# Patient Record
Sex: Female | Born: 1990 | Race: Black or African American | Hispanic: No | Marital: Single | Smoking: Former smoker
Health system: Southern US, Community
[De-identification: ages and names within clinical notes are randomized; demographics above are authoritative.]

## PROBLEM LIST (undated history)

## (undated) DIAGNOSIS — J45909 Unspecified asthma, uncomplicated: Secondary | ICD-10-CM

## (undated) DIAGNOSIS — R030 Elevated blood-pressure reading, without diagnosis of hypertension: Secondary | ICD-10-CM

## (undated) DIAGNOSIS — L732 Hidradenitis suppurativa: Secondary | ICD-10-CM

## (undated) DIAGNOSIS — R51 Headache: Secondary | ICD-10-CM

## (undated) DIAGNOSIS — F32A Depression, unspecified: Secondary | ICD-10-CM

## (undated) DIAGNOSIS — R519 Headache, unspecified: Secondary | ICD-10-CM

## (undated) DIAGNOSIS — F329 Major depressive disorder, single episode, unspecified: Secondary | ICD-10-CM

## (undated) HISTORY — PX: ADENOIDECTOMY: SUR15

## (undated) HISTORY — DX: Headache, unspecified: R51.9

## (undated) HISTORY — PX: TONSILLECTOMY AND ADENOIDECTOMY: SUR1326

## (undated) HISTORY — DX: Major depressive disorder, single episode, unspecified: F32.9

## (undated) HISTORY — DX: Headache: R51

## (undated) HISTORY — DX: Hidradenitis suppurativa: L73.2

## (undated) HISTORY — DX: Elevated blood-pressure reading, without diagnosis of hypertension: R03.0

## (undated) HISTORY — PX: WISDOM TOOTH EXTRACTION: SHX21

## (undated) HISTORY — DX: Depression, unspecified: F32.A

---
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
## 2012-06-08 ENCOUNTER — Encounter (HOSPITAL_COMMUNITY): Payer: Self-pay | Admitting: *Deleted

## 2012-06-08 ENCOUNTER — Emergency Department (HOSPITAL_COMMUNITY)
Admission: EM | Admit: 2012-06-08 | Discharge: 2012-06-08 | Disposition: A | Discharge status: Home / Self Care | Payer: Medicaid Other | Attending: Emergency Medicine | Admitting: Emergency Medicine

## 2012-06-08 DIAGNOSIS — M549 Dorsalgia, unspecified: Secondary | ICD-10-CM | POA: Insufficient documentation

## 2012-06-08 DIAGNOSIS — N390 Urinary tract infection, site not specified: Secondary | ICD-10-CM

## 2012-06-08 DIAGNOSIS — J45909 Unspecified asthma, uncomplicated: Secondary | ICD-10-CM | POA: Insufficient documentation

## 2012-06-08 DIAGNOSIS — Z3202 Encounter for pregnancy test, result negative: Secondary | ICD-10-CM | POA: Insufficient documentation

## 2012-06-08 HISTORY — DX: Unspecified asthma, uncomplicated: J45.909

## 2012-06-08 LAB — URINALYSIS, ROUTINE W REFLEX MICROSCOPIC
Glucose, UA: NEGATIVE mg/dL
Nitrite: NEGATIVE
Urobilinogen, UA: 0.2 mg/dL (ref 0.0–1.0)

## 2012-06-08 LAB — POCT PREGNANCY, URINE: Preg Test, Ur: NEGATIVE

## 2012-06-08 LAB — URINE MICROSCOPIC-ADD ON

## 2012-06-08 MED ORDER — METHOCARBAMOL 500 MG PO TABS: 500.0000 mg | ORAL_TABLET | Freq: Two times a day (BID) | ORAL | Status: DC | PRN

## 2012-06-08 MED ORDER — SULFAMETHOXAZOLE-TRIMETHOPRIM 800-160 MG PO TABS: 1.0000 | ORAL_TABLET | Freq: Two times a day (BID) | ORAL | Status: DC

## 2012-06-08 NOTE — ED Notes (Signed)
Pt reports onset of back pain 3 months ago. Denies any injury. Pain has gotten worse over past few days. Pain worse on lower back, but c/o entire back hurting. Pain radiates to left flank, describes as "stabbing".

## 2012-06-08 NOTE — ED Notes (Signed)
PA notified of blood tinged urine. PA verbalized to continue urinalysis with urine sample obtained.

## 2012-06-08 NOTE — ED Provider Notes (Signed)
History     CSN: [id number]  Arrival date & time 06/08/12  1128   First MD Initiated Contact with Patient 06/08/12 1140      Chief Complaint  Patient presents with  . Back Pain    (Consider location/radiation/quality/duration/timing/severity/associated sxs/prior treatment) HPI Comments: Patient is a 22 year old female who presents today with thoracic and low back pain. It has been present for the past 3 months, but has recently worsened in the past few days. She describes the pain as stabbing radiating up and down the sides of her back. She is able to walk. Nothing has made the pain better, the pain is worse with movement. She reports she is having urinary frequency and urgency, but no dysuria. No fevers, chills, abdominal pain, vomiting, SOB, CP, numbness, weakness.      Past Medical History  Diagnosis Date  . Asthma     Past Surgical History  Procedure Laterality Date  . Tonsillectomy      No family history on file.  History  Substance Use Topics  . Smoking status: Never Smoker   . Smokeless tobacco: Not on file  . Alcohol Use: Yes     Comment: socially    OB History   Grav Para Term Preterm Abortions TAB SAB Ect Mult Living                  Review of Systems  Constitutional: Negative for fever and chills.  Cardiovascular: Negative for chest pain.  Gastrointestinal: Negative for vomiting and abdominal pain.  Genitourinary: Positive for urgency and frequency. Negative for dysuria and difficulty urinating.  All other systems reviewed and are negative.    Allergies  Review of patient's allergies indicates no known allergies.  Home Medications   Current Outpatient Rx  Name  Route  Sig  Dispense  Refill  . acetaminophen (TYLENOL) 500 MG tablet   Oral   Take 500 mg by mouth every 6 (six) hours as needed for pain.         Marland Kitchen albuterol (PROVENTIL HFA;VENTOLIN HFA) 108 (90 BASE) MCG/ACT inhaler   Inhalation   Inhale 2 puffs into the lungs every 6 (six)  hours as needed for wheezing.           BP 145/86  Pulse 79  Temp(Src) 98.7 F (37.1 C) (Oral)  Resp 14  SpO2 100%  LMP 06/07/2012  Physical Exam  Nursing note and vitals reviewed. Constitutional: She is oriented to person, place, and time. She appears well-developed and well-nourished. No distress.  HENT:  Head: Normocephalic and atraumatic.  Right Ear: External ear normal.  Left Ear: External ear normal.  Nose: Nose normal.  Mouth/Throat: Oropharynx is clear and moist.  Eyes: Conjunctivae are normal.  Neck: Normal range of motion.  Cardiovascular: Normal rate, regular rhythm and normal heart sounds.   Pulmonary/Chest: Effort normal and breath sounds normal. No stridor. No respiratory distress. She has no wheezes. She has no rales.  Abdominal: Soft. Bowel sounds are normal. She exhibits no distension. There is no tenderness. There is no CVA tenderness.  Musculoskeletal: Normal range of motion.  ttp over musculature of right thoracic and lumbar back No bony tenderness, deformity, or step offs   Neurological: She is alert and oriented to person, place, and time. She has normal strength.  Skin: Skin is warm and dry. She is not diaphoretic. No erythema.  Psychiatric: She has a normal mood and affect. Her behavior is normal.    ED Course  Procedures (including critical care time)  Labs Reviewed  URINALYSIS, ROUTINE W REFLEX MICROSCOPIC - Abnormal; Notable for the following:    Color, Urine RED (*)    APPearance CLOUDY (*)    Hgb urine dipstick LARGE (*)    Protein, ur 30 (*)    Leukocytes, UA SMALL (*)    All other components within normal limits  URINE MICROSCOPIC-ADD ON - Abnormal; Notable for the following:    Bacteria, UA MANY (*)    All other components within normal limits  URINE CULTURE  POCT PREGNANCY, URINE   No results found.  When I entered the room to discuss the patient's discharge, the patient put down her phone and was crying. She told me that it had  nothing to do with the reason that she was here and did not want to talk about the reason she was crying.   1. UTI (lower urinary tract infection)   2. Back pain       MDM  Patient presents with 3 months of back pain and urinary urgency and frequency. She has no red flags including bowel or bladder incontinence, IVDA, or hx of CA. No concern for cauda equina, spinal abscess, or fracture. Due to urinary urgency and frequency I will treat her with a 3 day course of Bactrim for UTI. Rest, ice back. Resource guide given so she can establish care with PCP. Strict return precautions given. VSS for discharge. Patient / Family / Caregiver informed of clinical course, understand medical decision-making process, and agree with plan.         Mora Bellman, PA-C 06/10/12 [phone]

## 2012-06-10 LAB — URINE CULTURE: Colony Count: >=100000

## 2012-06-11 ENCOUNTER — Telehealth (HOSPITAL_COMMUNITY): Payer: Self-pay | Admitting: Emergency Medicine

## 2012-06-11 NOTE — ED Provider Notes (Signed)
Medical screening examination/treatment/procedure(s) were performed by non-physician practitioner and as supervising physician I was immediately available for consultation/collaboration.    Roseanne Juenger R Pernie Grosso, MD 06/11/12 0423 

## 2013-04-09 ENCOUNTER — Other Ambulatory Visit (HOSPITAL_COMMUNITY)
Admission: RE | Admit: 2013-04-09 | Discharge: 2013-04-09 | Disposition: A | Discharge status: Home / Self Care | Payer: Medicaid Other | Source: Ambulatory Visit | Attending: Sports Medicine | Admitting: Sports Medicine

## 2013-04-09 ENCOUNTER — Ambulatory Visit (INDEPENDENT_AMBULATORY_CARE_PROVIDER_SITE_OTHER): Payer: Medicaid Other | Admitting: Sports Medicine

## 2013-04-09 ENCOUNTER — Encounter: Payer: Self-pay | Admitting: Sports Medicine

## 2013-04-09 VITALS — BP 122/81 | HR 75 | Temp 98.7°F | Ht 66.5 in | Wt 314.0 lb

## 2013-04-09 DIAGNOSIS — R51 Headache: Secondary | ICD-10-CM

## 2013-04-09 DIAGNOSIS — R519 Headache, unspecified: Secondary | ICD-10-CM | POA: Insufficient documentation

## 2013-04-09 DIAGNOSIS — Z299 Encounter for prophylactic measures, unspecified: Secondary | ICD-10-CM

## 2013-04-09 DIAGNOSIS — Z209 Contact with and (suspected) exposure to unspecified communicable disease: Secondary | ICD-10-CM

## 2013-04-09 DIAGNOSIS — Z113 Encounter for screening for infections with a predominantly sexual mode of transmission: Secondary | ICD-10-CM | POA: Insufficient documentation

## 2013-04-09 DIAGNOSIS — M545 Low back pain, unspecified: Secondary | ICD-10-CM | POA: Insufficient documentation

## 2013-04-09 LAB — COMPREHENSIVE METABOLIC PANEL
ALK PHOS: 79 U/L (ref 39–117)
ALT: 22 U/L (ref 0–35)
AST: 17 U/L (ref 0–37)
Albumin: 3.9 g/dL (ref 3.5–5.2)
BUN: 9 mg/dL (ref 6–23)
CHLORIDE: 106 meq/L (ref 96–112)
CO2: 26 mEq/L (ref 19–32)
CREATININE: 0.9 mg/dL (ref 0.50–1.10)
Calcium: 9.3 mg/dL (ref 8.4–10.5)
Glucose, Bld: 91 mg/dL (ref 70–99)
POTASSIUM: 4.4 meq/L (ref 3.5–5.3)
Sodium: 138 mEq/L (ref 135–145)
TOTAL PROTEIN: 6.8 g/dL (ref 6.0–8.3)
Total Bilirubin: 0.3 mg/dL (ref 0.2–1.2)

## 2013-04-09 LAB — CBC
HEMATOCRIT: 40 % (ref 36.0–46.0)
Hemoglobin: 13.1 g/dL (ref 12.0–15.0)
MCH: 28.9 pg (ref 26.0–34.0)
MCHC: 32.8 g/dL (ref 30.0–36.0)
MCV: 88.3 fL (ref 78.0–100.0)
Platelets: 294 10*3/uL (ref 150–400)
RBC: 4.53 MIL/uL (ref 3.87–5.11)
RDW: 14.8 % (ref 11.5–15.5)
WBC: 7.1 10*3/uL (ref 4.0–10.5)

## 2013-04-09 LAB — LIPID PANEL
Cholesterol: 194 mg/dL (ref 0–200)
HDL: 41 mg/dL (ref >39)
LDL CALC: 124 mg/dL — AB (ref 0–99)
TRIGLYCERIDES: 145 mg/dL (ref <150)
Total CHOL/HDL Ratio: 4.7 Ratio
VLDL: 29 mg/dL (ref 0–40)

## 2013-04-09 LAB — HIV ANTIBODY (ROUTINE TESTING W REFLEX): HIV: NONREACTIVE

## 2013-04-09 LAB — RPR

## 2013-04-09 MED ORDER — ALBUTEROL SULFATE HFA 108 (90 BASE) MCG/ACT IN AERS: 2.0000 | INHALATION_SPRAY | Freq: Four times a day (QID) | RESPIRATORY_TRACT | Status: DC | PRN

## 2013-04-09 NOTE — Progress Notes (Signed)
Roberta Rodriguez - 23 y.o. female MRN [id number]  Date of birth: 02-28-1990  Chief Complaint  Patient presents with  . Establish Care   Problems/Dx addressed during visit General Plan and Pt Instructions  1. Preventive measure   2. Vaginitis and vulvovaginitis, unspecified   3. Obesity, morbid   4. Headache(784.0)       Start with basic exercises for your back and neck including towel stretch his endoluminal exercises as we described.  Do these at least once per day and work your way up to 5 times daily.  Return for a PAP smear at your convience  We are checking some labs today including for STIs.  We will discuss these results at your next visit.  Sign up for Mychart      HPI, INTERVAL HISTORY & ROS    She reports overall having significant lack of energy with chronic daily low back pain and headaches.  She takes daily Excedrin migraine but awakens each morning with recurrent symptoms.  No changes in vision.  No photophobia, nausea or vomiting.  Usually right sided, radiating from posterior aspect of school up.  Usually when headache is present there is a "knot on the back of her head."  She reports her overall mood is okay and denies any depression at this time has been treated for depression in the past.  History of asthma with frequent albuterol use.  Reports significant dyspnea on exertion that she associates with her weight and asthma.  No orthopnea, known sleep apnea.  Current every day smoker.  Would like to be checked for sexually transmitted infections.  She is actually active.  Has not had a Pap smear.  No history of sexual transmitted infections.  Denies dysuria, frequency, discharge.  LMP last week.  Pt denies any radicular symptoms, change in bowel or bladder habits, muscle weakness or falls associated with back pain.  No fevers, chills, night sweats or weight loss.  Pertinent History & Care Coordination  No Patient Care Coordination Note on file.  History    Smoking status  . Current Every Day Smoker -- 0.30 packs/day  . Types: Cigarettes  Smokeless tobacco  . Not on file  No health maintenance topics applied. No results found for this basename: HGBA1C, TRIG, CHOL, HDL, LDLCALC, LDLDIRECT, TSH, T3FREE, FREET4, T3TOTAL, T4TOTAL, THYROIDAB,  in the last 8760 hours     Otherwise past Medical, Surgical, Social, and Family History Reviewed per EMR Medications and Allergies reviewed and all updated if necessary. Objective Findings  VITALS: HR: 75 bpm  BP: 122/81 mmHg  TEMP: 98.7 F (37.1 C) (Oral)  RESP:    HT: 5' 6.5" (168.9 cm)  WT: 314 lb (142.429 kg)  BMI: 50   BP Readings from Last 3 Encounters:  04/09/13 122/81  06/08/12 140/80   Wt Readings from Last 3 Encounters:  04/09/13 314 lb (142.429 kg)     PHYSICAL EXAM: GENERAL:  morbidly obese African American female. In no discomfort; no respiratory distress  PSYCH: alert and appropriate, good insight   HNEENT:  no JVD, broad-based neck.    CARDIO: RRR, S1/S2 heard, no murmur  LUNGS: CTA B, no wheezes, no crackles  ABDOMEN:  obese   EXTREM:   moves all 4 extremities spontaneously  Low back Exam: Appear:  normal alignment but difficult to evaluate due to obesity   Palp:  diffuse lumbar tenderness including diffuse midline tenderness   ROM:  Limited flexion extension due to obesity   NV:  lower extremity myotomes dermatomes intact to gross evaluation   Testing:  negative straight leg raise bilaterally    Neck Exam: Appear:  normal alignment   Palp:  diffuse paracervical tenderness and muscle spasms   ROM:  Limited rotation to the right   NV:   upper extremity myotomes and dermatomes intact   Testing:  negative Spurling's     GU:   SKIN:     Medications, Labs & Other Orders   Previous Medications   No medications on file   Modified Medications   Modified Medication Previous Medication   ALBUTEROL (PROVENTIL HFA;VENTOLIN HFA) 108 (90 BASE) MCG/ACT INHALER albuterol  (PROVENTIL HFA;VENTOLIN HFA) 108 (90 BASE) MCG/ACT inhaler      Inhale 2 puffs into the lungs every 6 (six) hours as needed for wheezing.    Inhale 2 puffs into the lungs every 6 (six) hours as needed for wheezing.   New Prescriptions   No medications on file   Discontinued Medications   ACETAMINOPHEN (TYLENOL) 500 MG TABLET    Take 500 mg by mouth every 6 (six) hours as needed for pain.   METHOCARBAMOL (ROBAXIN) 500 MG TABLET    Take 1 tablet (500 mg total) by mouth 2 (two) times daily as needed.   SULFAMETHOXAZOLE-TRIMETHOPRIM (SEPTRA DS) 800-160 MG PER TABLET    Take 1 tablet by mouth 2 (two) times daily.   Orders Placed This Encounter  Procedures  . Comprehensive metabolic panel  . CBC  . Lipid panel  . HIV antibody  . RPR   Assessment & Plan  As above & for further discussion see problem based charting if applicable.

## 2013-04-09 NOTE — Assessment & Plan Note (Signed)
Encouraged significant TLC (Therapeutic Lifestyle Changes)

## 2013-04-09 NOTE — Assessment & Plan Note (Signed)
No red flags. Likely similar etiology as above with increase strain do to obesity. Encourage range of motion exercises for the lumbar spine.

## 2013-04-09 NOTE — Assessment & Plan Note (Signed)
Basic labs including STI screening.  Patient currently asymptomatic but essentially active. Patient will return for initial Pap smear in the next 1-2 weeks.

## 2013-04-09 NOTE — Patient Instructions (Signed)
   Start with basic exercises for your back and neck including towel stretch his endoluminal exercises as we described.  Do these at least once per day and work your way up to 5 times daily.  Return for a PAP smear at your convience  We are checking some labs today including for STIs.  We will discuss these results at your next visit.  Sign up for Mychart    If you need anything prior to your next visit please call the clinic. Please Bring all medications or accurate medication list with you to each appointment; an accurate medication list is essential in providing you the best care possible.

## 2013-04-09 NOTE — Assessment & Plan Note (Addendum)
Multiple etiologies likely due to MSK causes did increase strain from prolonged sitting at a desk and morbid obesity. Reviewed range of motion and exercise activities including towel stretching. Recommending avoiding all NSAIDs for concern of medication overuse headache that she's been taking daily Excedrin for a prolonged period

## 2013-04-12 ENCOUNTER — Encounter: Payer: Self-pay | Admitting: Sports Medicine

## 2013-04-29 ENCOUNTER — Other Ambulatory Visit (HOSPITAL_COMMUNITY)
Admission: RE | Admit: 2013-04-29 | Discharge: 2013-04-29 | Disposition: A | Discharge status: Home / Self Care | Payer: Medicaid Other | Source: Ambulatory Visit | Attending: Sports Medicine | Admitting: Sports Medicine

## 2013-04-29 ENCOUNTER — Ambulatory Visit (INDEPENDENT_AMBULATORY_CARE_PROVIDER_SITE_OTHER): Payer: Medicaid Other | Admitting: Sports Medicine

## 2013-04-29 ENCOUNTER — Encounter: Payer: Self-pay | Admitting: Sports Medicine

## 2013-04-29 VITALS — BP 148/95 | HR 84 | Temp 98.1°F | Ht 66.0 in | Wt 319.1 lb

## 2013-04-29 DIAGNOSIS — Z299 Encounter for prophylactic measures, unspecified: Secondary | ICD-10-CM

## 2013-04-29 DIAGNOSIS — N611 Abscess of the breast and nipple: Secondary | ICD-10-CM

## 2013-04-29 DIAGNOSIS — L02239 Carbuncle of trunk, unspecified: Secondary | ICD-10-CM

## 2013-04-29 DIAGNOSIS — Z124 Encounter for screening for malignant neoplasm of cervix: Secondary | ICD-10-CM

## 2013-04-29 DIAGNOSIS — L02224 Furuncle of groin: Secondary | ICD-10-CM

## 2013-04-29 DIAGNOSIS — L02229 Furuncle of trunk, unspecified: Secondary | ICD-10-CM

## 2013-04-29 DIAGNOSIS — Z01419 Encounter for gynecological examination (general) (routine) without abnormal findings: Secondary | ICD-10-CM | POA: Insufficient documentation

## 2013-04-29 DIAGNOSIS — N61 Mastitis without abscess: Secondary | ICD-10-CM

## 2013-04-29 MED ORDER — DOXYCYCLINE HYCLATE 100 MG PO TABS: 100.0000 mg | ORAL_TABLET | Freq: Two times a day (BID) | ORAL | Status: DC

## 2013-04-29 NOTE — Assessment & Plan Note (Signed)
PAP smear today.  

## 2013-04-29 NOTE — Patient Instructions (Signed)
   Take doxy for 10 days  Use moist heat twice a day for 3-5 days to both breast and groin  I will send you results of your PAP smear    If you need anything prior to your next visit please call the clinic. Please Bring all medications or accurate medication list with you to each appointment; an accurate medication list is essential in providing you the best care possible.

## 2013-04-29 NOTE — Progress Notes (Signed)
  Roberta Rodriguez - 23 y.o. female MRN [id number]  Date of birth: 11-12-1990  CC & Problems Addressed: General Plan & Pt Instructions:  Chief Complaint  Patient presents with  . Gynecologic Exam      Take doxy for 10 days  Use moist heat twice a day for 3-5 days to both breast and groin  I will send you results of your PAP smear      SUBJECTIVE:   She reports needing a pap smear and having 2 skin lesions  Left breast has been present for approximately 2 weeks, came to ahead and rupture with draining serosanguineous fluid over the past 3 days.  Left labial lesion patient reports intermittently having due to shaving.  Also present for 3 days.  Pt denies any fevers, chills, or rigors.  No nausea or vomiting.  No dysuria, frequency, hesitancy or flank pain. For further subjective including (HPI, Interval History & ROS) please see problem based charting  HISTORY:  Recent Labs  04/09/13 1140  TRIG 145  CHOL 194  HDL 41  LDLCALC 124*   Wt Readings from Last 3 Encounters:  04/29/13 319 lb 1.6 oz (144.743 kg)  04/09/13 314 lb (142.429 kg)   BP Readings from Last 3 Encounters:  04/29/13 148/95  04/09/13 122/81  06/08/12 140/80    History  Smoking status  . Current Every Day Smoker -- 0.30 packs/day  . Types: Cigarettes  Smokeless tobacco  . Not on file   Health Maintenance Due  Topic  . Tetanus/tdap     Otherwise past Medical, Surgical, Social, and Family History Reviewed per EMR Medications and Allergies reviewed and updated per below.  VITALS: Reviewed per EMR.   PHYSICAL EXAM: GENERAL: Adult African American, obese  female. In no discomfort; no respiratory distress  PSYCH: alert and appropriate, good insight   GU: Chaperone: CMA, Clarise Cruz  External: Left superior labial non-fluctuant closed furuncle. Shaved mons, Otherwise normal appearing genitalia, no discharge  Vagina: no vaginal lesion, vaginal mucosa moist  Cervix: no cervical lesions, no CMT  Uterus:  normal size, shape, consistency  non-tender  Adenexa: no masses, non-tender  TESTS:  PAP smear    SKIN: Left breast with small approximately 0.5 cm open/draining     MEDICATIONS, LABS & OTHER ORDERS: Previous Medications   ALBUTEROL (PROVENTIL HFA;VENTOLIN HFA) 108 (90 BASE) MCG/ACT INHALER    Inhale 2 puffs into the lungs every 6 (six) hours as needed for wheezing.   Modified Medications   No medications on file   New Prescriptions   DOXYCYCLINE (VIBRA-TABS) 100 MG TABLET    Take 1 tablet (100 mg total) by mouth 2 (two) times daily.   Discontinued Medications   No medications on file  No orders of the defined types were placed in this encounter.    ASSESSMENT & PLAN:  See problem based charting & AVS for pt instructions.

## 2013-04-30 ENCOUNTER — Encounter: Payer: Self-pay | Admitting: Sports Medicine

## 2013-04-30 NOTE — Assessment & Plan Note (Addendum)
Acute, resulting condition Exam reassuring for no deep abscess Given multiple lesions will treat with doxycycline for 10 days.   > Follow up resolution, consider breast imaging if persistent and referral to surgery versus I&D

## 2013-04-30 NOTE — Assessment & Plan Note (Signed)
Acute recurrent condition. discussed proper hygiene including the avoidance of shaving if appropriate Small and superficial.  Will likely drain spontaneously with warm compresses as instructed.  Doxy for 10 days.

## 2013-05-21 ENCOUNTER — Encounter: Payer: Self-pay | Admitting: Sports Medicine

## 2013-06-05 ENCOUNTER — Ambulatory Visit (INDEPENDENT_AMBULATORY_CARE_PROVIDER_SITE_OTHER): Payer: Medicaid Other | Admitting: Sports Medicine

## 2013-06-05 ENCOUNTER — Encounter: Payer: Self-pay | Admitting: Sports Medicine

## 2013-06-05 VITALS — BP 140/87 | HR 86 | Temp 99.3°F | Ht 66.5 in | Wt 314.0 lb

## 2013-06-05 DIAGNOSIS — F32A Depression, unspecified: Secondary | ICD-10-CM

## 2013-06-05 DIAGNOSIS — F3289 Other specified depressive episodes: Secondary | ICD-10-CM

## 2013-06-05 DIAGNOSIS — F329 Major depressive disorder, single episode, unspecified: Secondary | ICD-10-CM

## 2013-06-05 MED ORDER — FLUOXETINE HCL 20 MG PO TABS: 20.0000 mg | ORAL_TABLET | Freq: Every day | ORAL | Status: DC

## 2013-06-05 NOTE — Patient Instructions (Signed)
Call [facility] to schedule counseling ASAP.  If you can follow up with your prior psychologist that would be great! Please send me a message about how the medication is doing next week. Follow up in 10 days with me

## 2013-06-08 DIAGNOSIS — F39 Unspecified mood [affective] disorder: Secondary | ICD-10-CM | POA: Insufficient documentation
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
## 2013-06-24 ENCOUNTER — Ambulatory Visit: Payer: Medicaid Other | Admitting: Sports Medicine

## 2013-07-01 ENCOUNTER — Ambulatory Visit (INDEPENDENT_AMBULATORY_CARE_PROVIDER_SITE_OTHER): Payer: Medicaid Other | Admitting: Sports Medicine

## 2013-07-01 ENCOUNTER — Encounter: Payer: Self-pay | Admitting: Sports Medicine

## 2013-07-01 VITALS — BP 130/58 | HR 91 | Temp 100.3°F | Ht 66.5 in | Wt 310.0 lb

## 2013-07-01 DIAGNOSIS — Z299 Encounter for prophylactic measures, unspecified: Secondary | ICD-10-CM

## 2013-07-01 DIAGNOSIS — Z23 Encounter for immunization: Secondary | ICD-10-CM

## 2013-07-01 DIAGNOSIS — F39 Unspecified mood [affective] disorder: Secondary | ICD-10-CM

## 2013-07-01 NOTE — Addendum Note (Signed)
Addended by: Corinna Capra on: 07/01/2013 12:19 PM   Modules accepted: Orders

## 2013-07-01 NOTE — Assessment & Plan Note (Signed)
Tdap Today

## 2013-07-01 NOTE — Progress Notes (Signed)
  Roberta Rodriguez - 23 y.o. female MRN [id number]  Date of birth: 12-19-1990  CC: Depression   SUBJECTIVE:     HPI Comments: Patient presents with:   Depression - follow up - better with prozac. Still tearful See problem based charting for additional problem specific subjective (including HPI, Interval History & ROS)   HISTORY:  Recent Labs  04/09/13 1140  TRIG 145  CHOL 194  HDL 41  LDLCALC 124*   History  Smoking status  . Current Every Day Smoker -- 0.30 packs/day  . Types: Cigarettes  Smokeless tobacco  . Not on file  Otherwise past Medical, Surgical, Social, and Family History Reviewed per EMR Medications and Allergies reviewed and updated per below.  OBJECTIVE: VITALS: BP: 130/58 mmHg  HR: 91 bpm  TEMP: 100.3 F (37.9 C) (Oral)  RESP:    HT: 5' 6.5" (168.9 cm)  WT: 310 lb (140.615 kg)  BMI: 49.4   BP Readings from Last 3 Encounters:  07/01/13 130/58  06/05/13 140/87  04/29/13 148/95   Wt Readings from Last 3 Encounters:  07/01/13 310 lb (140.615 kg)  06/05/13 314 lb (142.429 kg)  04/29/13 319 lb 1.6 oz (144.743 kg)     Physical Exam  Vitals reviewed. Constitutional: She is well-developed, well-nourished, and in no distress.  HENT:  Head: Normocephalic and atraumatic.  Right Ear: External ear normal.  Left Ear: External ear normal.  Musculoskeletal: She exhibits no edema and no tenderness.  Neurological: She is alert.  Moves all 4 extremities spontaneously; no lateralization.  Skin: Skin is warm and dry. She is not diaphoretic.  Psychiatric: Judgment normal.  Smiling, not tearful.  Good insight.  Somewhat flattened affect.  No evidence of SI/HI   MEDICATIONS, LABS & OTHER ORDERS: Previous Medications   ALBUTEROL (PROVENTIL HFA;VENTOLIN HFA) 108 (90 BASE) MCG/ACT INHALER    Inhale 2 puffs into the lungs every 6 (six) hours as needed for wheezing.   DOXYCYCLINE (VIBRA-TABS) 100 MG TABLET    Take 1 tablet (100 mg total) by mouth 2 (two) times  daily.   FLUOXETINE (PROZAC) 20 MG TABLET    Take 1 tablet (20 mg total) by mouth daily.   Modified Medications   No medications on file   New Prescriptions   No medications on file   Discontinued Medications   No medications on file  No orders of the defined types were placed in this encounter.   ASSESSMENT & PLAN: See problem based charting & AVS for pt instructions.

## 2013-07-01 NOTE — Assessment & Plan Note (Signed)
Problem Based Documentation:    Subjective Report:  Patient reports overall improved symptoms since starting Prozac however still tearful on regular basis.  No evidence of SI/HI.  Not doing any physical activity.  Reporting worsening congestion but overall no tachypalpitations, orthostasis or dizziness, lightheadedness.  She does think she has a worsening of her allergies or a cold.     Assessment & Plan & Follow up Issues:  Chronic condition 1. Continue Prozac 20 mg.  Anticipate 6 weeks of improved affect. 2. Follow up with counseling services.  Information previously provided. 3. Start daily exercise routine > Follow up with counseling

## 2013-07-01 NOTE — Patient Instructions (Signed)
It was grapeseed today.  Glad you're feeling somewhat better and I expect he continued to improve over the next 6-8 weeks. Please followup with one of the counseling services we provided the last time I think this would be beneficial for you. We start exercising on a daily basis.  You stated you think the best option for you would be dancing.  Here are some basic exercise recommendations to remember:  Try to be active every day and throughout the day.    We have actually found that being active throughout the day is likely more important than getting to the gym 5 days per week.  Minimizing being in active should be an important health goal we all are working towards.  A basic starting point can be limiting the time that you sit or lay still during the day.  You should sit/lay/lounge for no longer than 20-30 minutes at a time if you are able.  Even interrupting sitting with standing/jumping jacks/dancing/etc for one minute can have significant health benefits.   Try to remember: "Why sit when you can stand, why stand when you can walk, why walk when you can run."  The point he is to look for opportunities during the day where you can increase your heart rate.    Ideally, I recommend you exercise for at least 30 minutes per day, 5 days per week.  This would involve any activity that will elevate your heart rate to the point that you have a hard time carrying on a normal conversation, but not to the point of being completely out of breath.  There are alternative options however this is a generally good goal to strive for.  You can adjust your intensity based on heart rate (HR).  To calculate your target HR take 220 minus your age, then multiply X 0.7 (70%).  Example for 23 year old:  220 - 9 = 180;  180 X 0.7 = 126  Try to keep your HR within 10 beats of this target throughout your exercise   I am always happy to talk more about "Exercise as medicine" if you are interested"

## 2013-09-08 ENCOUNTER — Ambulatory Visit: Payer: Medicaid Other | Admitting: Family Medicine

## 2013-11-12 ENCOUNTER — Encounter: Payer: Self-pay | Admitting: Family Medicine

## 2013-11-12 ENCOUNTER — Ambulatory Visit (INDEPENDENT_AMBULATORY_CARE_PROVIDER_SITE_OTHER): Payer: Medicaid Other | Admitting: Family Medicine

## 2013-11-12 ENCOUNTER — Other Ambulatory Visit (HOSPITAL_COMMUNITY)
Admission: RE | Admit: 2013-11-12 | Discharge: 2013-11-12 | Disposition: A | Discharge status: Home / Self Care | Payer: Medicaid Other | Source: Ambulatory Visit | Attending: Family Medicine | Admitting: Family Medicine

## 2013-11-12 ENCOUNTER — Telehealth: Payer: Self-pay | Admitting: Family Medicine

## 2013-11-12 VITALS — BP 143/91 | HR 90 | Temp 98.4°F | Ht 66.5 in | Wt 309.0 lb

## 2013-11-12 DIAGNOSIS — N760 Acute vaginitis: Secondary | ICD-10-CM

## 2013-11-12 DIAGNOSIS — R109 Unspecified abdominal pain: Secondary | ICD-10-CM

## 2013-11-12 DIAGNOSIS — Z113 Encounter for screening for infections with a predominantly sexual mode of transmission: Secondary | ICD-10-CM | POA: Insufficient documentation

## 2013-11-12 LAB — POCT URINALYSIS DIPSTICK
Bilirubin, UA: NEGATIVE
Glucose, UA: NEGATIVE
KETONES UA: NEGATIVE
NITRITE UA: NEGATIVE
PROTEIN UA: NEGATIVE
Spec Grav, UA: >=1.03
Urobilinogen, UA: 0.2
pH, UA: 6

## 2013-11-12 LAB — POCT URINE PREGNANCY: Preg Test, Ur: NEGATIVE

## 2013-11-12 LAB — POCT WET PREP (WET MOUNT): Clue Cells Wet Prep Whiff POC: POSITIVE

## 2013-11-12 LAB — POCT UA - MICROSCOPIC ONLY

## 2013-11-12 MED ORDER — METRONIDAZOLE 500 MG PO TABS: 500.0000 mg | ORAL_TABLET | Freq: Three times a day (TID) | ORAL | Status: DC

## 2013-11-12 NOTE — Assessment & Plan Note (Addendum)
Wet prep>> multiple clues, positive with >> Flagyl prescribed UA>> basically normal, a few intact blood cells Pregnancy >> negative Normal pelvic exam today. Mild amount of discharge. Rectal exam benign, possibly small internal hemorrhoid. No fissure noted. I believe patient is constipated causing her discomfort. Advised her to take MiraLAX daily to help clean her intestine. I gave her and AVS on constipation and hemorrhoids. In addition she did have bacterial vaginosis, Flagyl prescribed. Patient to followup with PCP in 2 weeks if symptoms are not improved

## 2013-11-12 NOTE — Patient Instructions (Signed)
Constipation  Constipation is when a person has fewer than three bowel movements a week, has difficulty having a bowel movement, or has stools that are dry, hard, or larger than normal. As people grow older, constipation is more common. If you try to fix constipation with medicines that make you have a bowel movement (laxatives), the problem may get worse. Long-term laxative use may cause the muscles of the colon to become weak. A low-fiber diet, not taking in enough fluids, and taking certain medicines may make constipation worse.   CAUSES   · Certain medicines, such as antidepressants, pain medicine, iron supplements, antacids, and water pills.    · Certain diseases, such as diabetes, irritable bowel syndrome (IBS), thyroid disease, or depression.    · Not drinking enough water.    · Not eating enough fiber-rich foods.    · Stress or travel.    · Lack of physical activity or exercise.    · Ignoring the urge to have a bowel movement.    · Using laxatives too much.    SIGNS AND SYMPTOMS   · Having fewer than three bowel movements a week.    · Straining to have a bowel movement.    · Having stools that are hard, dry, or larger than normal.    · Feeling full or bloated.    · Pain in the lower abdomen.    · Not feeling relief after having a bowel movement.    DIAGNOSIS   Your health care provider will take a medical history and perform a physical exam. Further testing may be done for severe constipation. Some tests may include:  · A barium enema X-ray to examine your rectum, colon, and, sometimes, your small intestine.    · A sigmoidoscopy to examine your lower colon.    · A colonoscopy to examine your entire colon.  TREATMENT   Treatment will depend on the severity of your constipation and what is causing it. Some dietary treatments include drinking more fluids and eating more fiber-rich foods. Lifestyle treatments may include regular exercise. If these diet and lifestyle recommendations do not help, your health care  provider may recommend taking over-the-counter laxative medicines to help you have bowel movements. Prescription medicines may be prescribed if over-the-counter medicines do not work.   HOME CARE INSTRUCTIONS   · Eat foods that have a lot of fiber, such as fruits, vegetables, whole grains, and beans.  · Limit foods high in fat and processed sugars, such as french fries, hamburgers, cookies, candies, and soda.    · A fiber supplement may be added to your diet if you cannot get enough fiber from foods.    · Drink enough fluids to keep your urine clear or pale yellow.    · Exercise regularly or as directed by your health care provider.    · Go to the restroom when you have the urge to go. Do not hold it.    · Only take over-the-counter or prescription medicines as directed by your health care provider. Do not take other medicines for constipation without talking to your health care provider first.    SEEK IMMEDIATE MEDICAL CARE IF:   · You have bright red blood in your stool.    · Your constipation lasts for more than 4 days or gets worse.    · You have abdominal or rectal pain.    · You have thin, pencil-like stools.    · You have unexplained weight loss.  MAKE SURE YOU:   · Understand these instructions.  · Will watch your condition.  · Will get help right away if you are not   you have with your health care provider.  Hemorrhoids Hemorrhoids are swollen veins around the rectum or anus. There are two types of hemorrhoids:   Internal hemorrhoids. These occur in the veins just inside the rectum. They may poke through to the outside and become irritated and painful.  External hemorrhoids. These occur in the veins outside  the anus and can be felt as a painful swelling or hard lump near the anus. CAUSES  Pregnancy.   Obesity.   Constipation or diarrhea.   Straining to have a bowel movement.   Sitting for long periods on the toilet.  Heavy lifting or other activity that caused you to strain.  Anal intercourse. SYMPTOMS   Pain.   Anal itching or irritation.   Rectal bleeding.   Fecal leakage.   Anal swelling.   One or more lumps around the anus.  DIAGNOSIS  Your caregiver may be able to diagnose hemorrhoids by visual examination. Other examinations or tests that may be performed include:   Examination of the rectal area with a gloved hand (digital rectal exam).   Examination of anal canal using a small tube (scope).   A blood test if you have lost a significant amount of blood.  A test to look inside the colon (sigmoidoscopy or colonoscopy). TREATMENT Most hemorrhoids can be treated at home. However, if symptoms do not seem to be getting better or if you have a lot of rectal bleeding, your caregiver may perform a procedure to help make the hemorrhoids get smaller or remove them completely. Possible treatments include:   Placing a rubber band at the base of the hemorrhoid to cut off the circulation (rubber band ligation).   Injecting a chemical to shrink the hemorrhoid (sclerotherapy).   Using a tool to burn the hemorrhoid (infrared light therapy).   Surgically removing the hemorrhoid (hemorrhoidectomy).   Stapling the hemorrhoid to block blood flow to the tissue (hemorrhoid stapling).  HOME CARE INSTRUCTIONS   Eat foods with fiber, such as whole grains, beans, nuts, fruits, and vegetables. Ask your doctor about taking products with added fiber in them (fibersupplements).  Increase fluid intake. Drink enough water and fluids to keep your urine clear or pale yellow.   Exercise regularly.   Go to the bathroom when you have the urge to have a bowel movement. Do  not wait.   Avoid straining to have bowel movements.   Keep the anal area dry and clean. Use wet toilet paper or moist towelettes after a bowel movement.   Medicated creams and suppositories may be used or applied as directed.   Only take over-the-counter or prescription medicines as directed by your caregiver.   Take warm sitz baths for 15-20 minutes, 3-4 times a day to ease pain and discomfort.   Place ice packs on the hemorrhoids if they are tender and swollen. Using ice packs between sitz baths may be helpful.   Put ice in a plastic bag.   Place a towel between your skin and the bag.   Leave the ice on for 15-20 minutes, 3-4 times a day.   Do not use a donut-shaped pillow or sit on the toilet for long periods. This increases blood pooling and pain.  SEEK MEDICAL CARE IF:  You have increasing pain and swelling that is not controlled by treatment or medicine.  You have uncontrolled bleeding.  You have difficulty or you are unable to have a bowel movement.  You have pain or inflammation outside the area of  the hemorrhoids. MAKE SURE YOU:  Understand these instructions.  Will watch your condition.  Will get help right away if you are not doing well or get worse. Document Released: 02/10/2000 Document Revised: 01/30/2012 Document Reviewed: 12/18/2011 [facility] Patient Information 2015 [place], [place]. This information is not intended to replace advice given to you by your health care provider. Make sure you discuss any questions you have with your health care provider.   Followup if necessary in 2 weeks. I believe you had constipation symptoms. You can take over-the-counter MiraLAX for a few days to make sure all stool was cleared out. If you continue to have symptoms or become febrile please be seen immediately.

## 2013-11-12 NOTE — Progress Notes (Signed)
   Subjective:    Patient ID: Roberta Rodriguez, female    DOB: 07-31-1990, 23 y.o.   MRN: [id number]  HPI Roberta Rodriguez is a 23 y.o. female present to [facility]  Abdominal/pelvic pain: Patient presents with abdominal, vaginal and rectal pain since she strained  Last night and attempt to have a bowel movement. She is unable to remember when her last bowel movement was prior to last night She has never experienced pain like this before. She points periumbilical for the start of the pain. Pain is constant. She denies history of hemorrhoids. She is a gravida 0. She denies fever, dysuria or urinary frequency. She states she does have frequent UTIs, but this feels different. She has not changed partners, and only has female partners. She attempted to take Gas-X and thinking it was gas pain which helped relieve gas, but patient still has pain. Her last menstrual period was 2 months ago, she states this is normal for her. No history of kidney stones in patient, however her mother does have kidney stones.   Review of Systems Per HPI     Objective:   Physical Exam  BP 143/91  Pulse 90  Temp(Src) 98.4 F (36.9 C) (Oral)  Ht 5' 6.5" (1.689 m)  Wt 309 lb (140.161 kg)  BMI 49.13 kg/m2  LMP 09/11/2013 Gen: Pleasant, African American female, no acute distress, nontoxic in appearance, well-developed, well-nourished, morbidly obese  Abd: Soft. obese. ND. Mild tenderness left side . BS present. No Masses palpated, exam difficult due to obesity   GYN:  External genitalia within normal limits.  Vaginal mucosa pink, moist, normal rugae.  Nonfriable cervix without lesions, minimal discharge and bleeding noted on speculum exam.  Bimanual exam revealed normal, nongravid uterus.  No cervical motion tenderness. No adnexal masses bilaterally.    Rectal exam: negative without mass, lesions or tenderness, no tenderness noted, internal hemorrhoids noted, sphincter tone normal. No anal fissure noted.          Assessment & Plan:

## 2013-11-13 LAB — CERVICOVAGINAL ANCILLARY ONLY
CHLAMYDIA, DNA PROBE: NEGATIVE
Neisseria Gonorrhea: NEGATIVE

## 2013-11-13 NOTE — Telephone Encounter (Signed)
Error

## 2013-11-16 ENCOUNTER — Telehealth: Payer: Self-pay | Admitting: Family Medicine

## 2013-11-16 NOTE — Telephone Encounter (Signed)
LVM for patient to call back. ?

## 2013-11-16 NOTE — Telephone Encounter (Signed)
Please call Lenay, her gonorrhea and chlamydia are negative. Thanks.

## 2013-11-17 NOTE — Telephone Encounter (Signed)
Spoke with patietn and informed her of below 

## 2014-01-04 ENCOUNTER — Emergency Department (HOSPITAL_COMMUNITY)
Admission: EM | Admit: 2014-01-04 | Discharge: 2014-01-05 | Disposition: A | Discharge status: Home / Self Care | Payer: Medicaid Other | Attending: Emergency Medicine | Admitting: Emergency Medicine

## 2014-01-04 ENCOUNTER — Encounter (HOSPITAL_COMMUNITY): Payer: Self-pay | Admitting: Emergency Medicine

## 2014-01-04 DIAGNOSIS — Z792 Long term (current) use of antibiotics: Secondary | ICD-10-CM | POA: Insufficient documentation

## 2014-01-04 DIAGNOSIS — Z3202 Encounter for pregnancy test, result negative: Secondary | ICD-10-CM | POA: Insufficient documentation

## 2014-01-04 DIAGNOSIS — Z79899 Other long term (current) drug therapy: Secondary | ICD-10-CM | POA: Diagnosis not present

## 2014-01-04 DIAGNOSIS — R112 Nausea with vomiting, unspecified: Secondary | ICD-10-CM | POA: Diagnosis not present

## 2014-01-04 DIAGNOSIS — Z720 Tobacco use: Secondary | ICD-10-CM | POA: Diagnosis not present

## 2014-01-04 DIAGNOSIS — Z8659 Personal history of other mental and behavioral disorders: Secondary | ICD-10-CM | POA: Diagnosis not present

## 2014-01-04 DIAGNOSIS — J45909 Unspecified asthma, uncomplicated: Secondary | ICD-10-CM | POA: Insufficient documentation

## 2014-01-04 DIAGNOSIS — R42 Dizziness and giddiness: Secondary | ICD-10-CM | POA: Insufficient documentation

## 2014-01-04 DIAGNOSIS — R197 Diarrhea, unspecified: Secondary | ICD-10-CM

## 2014-01-04 DIAGNOSIS — E669 Obesity, unspecified: Secondary | ICD-10-CM | POA: Diagnosis not present

## 2014-01-04 DIAGNOSIS — K5900 Constipation, unspecified: Secondary | ICD-10-CM | POA: Insufficient documentation

## 2014-01-04 DIAGNOSIS — R55 Syncope and collapse: Secondary | ICD-10-CM | POA: Insufficient documentation

## 2014-01-04 LAB — CBC WITH DIFFERENTIAL/PLATELET
BASOS PCT: 0 % (ref 0–1)
Basophils Absolute: 0 10*3/uL (ref 0.0–0.1)
EOS ABS: 0.1 10*3/uL (ref 0.0–0.7)
Eosinophils Relative: 1 % (ref 0–5)
HCT: 39.8 % (ref 36.0–46.0)
HEMOGLOBIN: 13.2 g/dL (ref 12.0–15.0)
Lymphocytes Relative: 46 % (ref 12–46)
Lymphs Abs: 4.3 10*3/uL — ABNORMAL HIGH (ref 0.7–4.0)
MCH: 29.3 pg (ref 26.0–34.0)
MCHC: 33.2 g/dL (ref 30.0–36.0)
MCV: 88.4 fL (ref 78.0–100.0)
MONOS PCT: 5 % (ref 3–12)
Monocytes Absolute: 0.5 10*3/uL (ref 0.1–1.0)
NEUTROS PCT: 48 % (ref 43–77)
Neutro Abs: 4.5 10*3/uL (ref 1.7–7.7)
Platelets: 271 10*3/uL (ref 150–400)
RBC: 4.5 MIL/uL (ref 3.87–5.11)
RDW: 14.3 % (ref 11.5–15.5)
WBC: 9.3 10*3/uL (ref 4.0–10.5)

## 2014-01-04 MED ORDER — SODIUM CHLORIDE 0.9 % IV BOLUS (SEPSIS)
1000.0000 mL | Freq: Once | INTRAVENOUS | Status: AC
Start: 2014-01-04 — End: 2014-01-05
  Administered 2014-01-04: 1000 mL via INTRAVENOUS

## 2014-01-04 NOTE — ED Notes (Signed)
Pt does not have to urinate at this time.  
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
## 2014-01-05 LAB — COMPREHENSIVE METABOLIC PANEL
ALT: 23 U/L (ref 0–35)
AST: 17 U/L (ref 0–37)
Albumin: 3.7 g/dL (ref 3.5–5.2)
Alkaline Phosphatase: 80 U/L (ref 39–117)
Anion gap: 11 (ref 5–15)
BUN: 12 mg/dL (ref 6–23)
CO2: 25 meq/L (ref 19–32)
Calcium: 9.7 mg/dL (ref 8.4–10.5)
Chloride: 102 mEq/L (ref 96–112)
Creatinine, Ser: 0.84 mg/dL (ref 0.50–1.10)
GLUCOSE: 95 mg/dL (ref 70–99)
Potassium: 4.5 mEq/L (ref 3.7–5.3)
Sodium: 138 mEq/L (ref 137–147)
TOTAL PROTEIN: 7.9 g/dL (ref 6.0–8.3)

## 2014-01-05 LAB — POC URINE PREG, ED: Preg Test, Ur: NEGATIVE

## 2014-01-05 LAB — URINALYSIS, ROUTINE W REFLEX MICROSCOPIC
BILIRUBIN URINE: NEGATIVE
Glucose, UA: NEGATIVE mg/dL
Hgb urine dipstick: NEGATIVE
Ketones, ur: NEGATIVE mg/dL
Nitrite: NEGATIVE
PROTEIN: NEGATIVE mg/dL
Specific Gravity, Urine: 1.023 (ref 1.005–1.030)
Urobilinogen, UA: 0.2 mg/dL (ref 0.0–1.0)
pH: 5.5 (ref 5.0–8.0)

## 2014-01-05 LAB — URINE MICROSCOPIC-ADD ON

## 2014-01-05 MED ORDER — ONDANSETRON HCL 4 MG PO TABS: 4.0000 mg | ORAL_TABLET | Freq: Four times a day (QID) | ORAL | Status: DC

## 2014-01-05 MED ORDER — DICYCLOMINE HCL 20 MG PO TABS: 20.0000 mg | ORAL_TABLET | Freq: Two times a day (BID) | ORAL | Status: DC

## 2014-01-05 MED ORDER — DICYCLOMINE HCL 20 MG PO TABS
20.0000 mg | ORAL_TABLET | Freq: Three times a day (TID) | ORAL | Status: DC
  Administered 2014-01-05: 20 mg via ORAL
  Filled 2014-01-05: qty 1

## 2014-01-05 NOTE — ED Provider Notes (Signed)
CSN: [id number]     Arrival date & time 01/04/14  2240 History   First MD Initiated Contact with Patient 01/04/14 2258     Chief Complaint  Patient presents with  . Emesis     (Consider location/radiation/quality/duration/timing/severity/associated sxs/prior Treatment) HPI Comments: Patient is a 23 year old female with history of asthma, depression who presents the emergency department for evaluation of nausea, vomiting, lightheadedness. She reports that today she was at work when she began to feel very hot. She had associated nausea and vomiting. She reports that her emesis initially was orange, but then turned into a lavender color. She felt as though she was going to pass out, but did not. She has been having issues with her bowels for many months. She alternates between constipation and diarrhea, sometimes in the same day. She was taking MiraLAX daily, but has stopped taking this. She did have one episode of diarrhea today. Today she ate cheese grits, eggs, chili cheese fries. She currently feels improved. She denies fever, chills, chest pain, shortness of breath.  Patient is a 23 y.o. female presenting with vomiting. The history is provided by the patient. No language interpreter was used.  Emesis Associated symptoms: abdominal pain and diarrhea   Associated symptoms: no chills     Past Medical History  Diagnosis Date  . Asthma   . Depression   . Frequent headaches    Past Surgical History  Procedure Laterality Date  . Tonsillectomy and adenoidectomy     Family History  Problem Relation Age of Onset  . Asthma    . Depression Mother   . Hypertension Mother   . Other      Multiple Cancers in Grandparents & Aunts/uncles (Brain, Lung, Cervix)  . Other      Heart Disease in Grandparents & Aunts/Uncles; Negative in 1st degree relatives  . Diabetes      in Grandparents & Aunts/Uncles - no 1st degree  . Hypertension      in Grandparents & Aunts/Uncles  . Stroke       Aunts/Uncles; no first degree  . Thyroid disease      Aunts/Uncles; no first degree   History  Substance Use Topics  . Smoking status: Current Every Day Smoker -- 0.30 packs/day    Types: Cigarettes  . Smokeless tobacco: Not on file  . Alcohol Use: Yes     Comment: socially   OB History    No data available     Review of Systems  Constitutional: Negative for fever and chills.  Respiratory: Negative for shortness of breath.   Cardiovascular: Negative for chest pain.  Gastrointestinal: Positive for nausea, vomiting, abdominal pain, diarrhea and constipation.  Genitourinary: Negative for vaginal bleeding, vaginal discharge and vaginal pain.  Neurological: Positive for light-headedness.  All other systems reviewed and are negative.     Allergies  Review of patient's allergies indicates no known allergies.  Home Medications   Prior to Admission medications   Medication Sig Start Date End Date Taking? Authorizing Provider  albuterol (PROVENTIL HFA;VENTOLIN HFA) 108 (90 BASE) MCG/ACT inhaler Inhale 2 puffs into the lungs every 6 (six) hours as needed for wheezing. 04/09/13  Yes Gerda Diss, DO  doxycycline (VIBRA-TABS) 100 MG tablet Take 1 tablet (100 mg total) by mouth 2 (two) times daily. 04/29/13   Gerda Diss, DO  FLUoxetine (PROZAC) 20 MG tablet Take 1 tablet (20 mg total) by mouth daily. 06/05/13   Gerda Diss, DO  metroNIDAZOLE (FLAGYL) 500 MG  tablet Take 1 tablet (500 mg total) by mouth 3 (three) times daily. 11/12/13   Renee A Kuneff, DO   BP 99/55 mmHg  Pulse 90  Temp(Src) 97.5 F (36.4 C) (Oral)  Resp 18  Ht 5\' 6"  (1.676 m)  Wt 309 lb (140.161 kg)  BMI 49.90 kg/m2  SpO2 100%  LMP 12/28/2013 (Exact Date) Physical Exam  Constitutional: She is oriented to person, place, and time. She appears well-developed and well-nourished. No distress.  obese  HENT:  Head: Normocephalic and atraumatic.  Right Ear: External ear normal.  Left Ear: External ear normal.   Nose: Nose normal.  Mouth/Throat: Oropharynx is clear and moist.  Eyes: Conjunctivae are normal.  Neck: Normal range of motion.  Cardiovascular: Normal rate, regular rhythm and normal heart sounds.   Pulmonary/Chest: Effort normal and breath sounds normal. No stridor. No respiratory distress. She has no wheezes. She has no rales.  Abdominal: Soft. Bowel sounds are normal. She exhibits no distension. There is no tenderness. There is no rigidity, no rebound and no guarding.  Musculoskeletal: Normal range of motion.  Neurological: She is alert and oriented to person, place, and time. She has normal strength.  Skin: Skin is warm and dry. She is not diaphoretic. No erythema.  Psychiatric: She has a normal mood and affect. Her behavior is normal.  Nursing note and vitals reviewed.   ED Course  Procedures (including critical care time) Labs Review Labs Reviewed  URINALYSIS, ROUTINE W REFLEX MICROSCOPIC - Abnormal; Notable for the following:    APPearance CLOUDY (*)    Leukocytes, UA SMALL (*)    All other components within normal limits  CBC WITH DIFFERENTIAL - Abnormal; Notable for the following:    Lymphs Abs 4.3 (*)    All other components within normal limits  COMPREHENSIVE METABOLIC PANEL - Abnormal; Notable for the following:    Total Bilirubin <0.2 (*)    All other components within normal limits  URINE MICROSCOPIC-ADD ON - Abnormal; Notable for the following:    Squamous Epithelial / LPF FEW (*)    Bacteria, UA FEW (*)    All other components within normal limits  POC URINE PREG, ED    Imaging Review No results found.   EKG Interpretation None      MDM   Final diagnoses:  Non-intractable vomiting with nausea, vomiting of unspecified type  Diarrhea    Patient presents to ED for evaluation of near syncope and abdominal pain. Patient with long standing issues with diarrhea and constipation. She did have vomiting today. Patient currently feels significantly improved  after IVF. Labs unremarkable. Patient will follow up with PCP. Discussed reasons to return to ED immediately. Vital signs stable for discharge. Patient / Family / Caregiver informed of clinical course, understand medical decision-making process, and agree with plan.     Elwyn Lade, PA-C 01/07/14 0037  Julianne Rice, MD 01/10/14 [phone]

## 2014-01-05 NOTE — Discharge Instructions (Signed)
Irritable Bowel Syndrome Irritable bowel syndrome (IBS) is caused by a disturbance of normal bowel function and is a common digestive disorder. You may also hear this condition called spastic colon, mucous colitis, and irritable colon. There is no cure for IBS. However, symptoms often gradually improve or disappear with a good diet, stress management, and medicine. This condition usually appears in late adolescence or early adulthood. Women develop it twice as often as men. CAUSES  After food has been digested and absorbed in the small intestine, waste material is moved into the large intestine, or colon. In the colon, water and salts are absorbed from the undigested products coming from the small intestine. The remaining residue, or fecal material, is held for elimination. Under normal circumstances, gentle, rhythmic contractions of the bowel walls push the fecal material along the colon toward the rectum. In IBS, however, these contractions are irregular and poorly coordinated. The fecal material is either retained too long, resulting in constipation, or expelled too soon, producing diarrhea. SIGNS AND SYMPTOMS  The most common symptom of IBS is abdominal pain. It is often in the lower left side of the abdomen, but it may occur anywhere in the abdomen. The pain comes from spasms of the bowel muscles happening too much and from the buildup of gas and fecal material in the colon. This pain:  Can range from sharp abdominal cramps to a dull, continuous ache.  Often worsens soon after eating.  Is often relieved by having a bowel movement or passing gas. Abdominal pain is usually accompanied by constipation, but it may also produce diarrhea. The diarrhea often occurs right after a meal or upon waking up in the morning. The stools are often soft, watery, and flecked with mucus. Other symptoms of IBS include:  Bloating.  Loss of appetite.  Heartburn.  Backache.  Dull pain in the arms or  shoulders.  Nausea.  Burping.  Vomiting.  Gas. IBS may also cause symptoms that are unrelated to the digestive system, such as:  Fatigue.  Headaches.  Anxiety.  Shortness of breath.  Trouble concentrating.  Dizziness. These symptoms tend to come and go. DIAGNOSIS  The symptoms of IBS may seem like symptoms of other, more serious digestive disorders. Your health care provider may want to perform tests to exclude these disorders.  TREATMENT Many medicines are available to help correct bowel function or relieve bowel spasms and abdominal pain. Among the medicines available are:  Laxatives for severe constipation and to help restore normal bowel habits.  Specific antidiarrheal medicines to treat severe or lasting diarrhea.  Antispasmodic agents to relieve intestinal cramps. Your health care provider may also decide to treat you with a mild tranquilizer or sedative during unusually stressful periods in your life. Your health care provider may also prescribe antidepressant medicine. The use of this medicine has been shown to reduce pain and other symptoms of IBS. Remember that if any medicine is prescribed for you, you should take it exactly as directed. Make sure your health care provider knows how well it worked for you. HOME CARE INSTRUCTIONS   Take all medicines as directed by your health care provider.  Avoid foods that are high in fat or oils, such as heavy cream, butter, frankfurters, sausage, and other fatty meats.  Avoid foods that make you go to the bathroom, such as fruit, fruit juice, and dairy products.  Cut out carbonated drinks, chewing gum, and "gassy" foods such as beans and cabbage. This may help relieve bloating and burping.    Eat foods with bran, and drink plenty of liquids with the bran foods. This helps relieve constipation.  Keep track of what foods seem to bring on your symptoms.  Avoid emotionally charged situations or circumstances that produce  anxiety.  Start or continue exercising.  Get plenty of rest and sleep. Document Released: 02/12/2005 Document Revised: 02/17/2013 Document Reviewed: 10/03/2007 [employer] Patient Information 2015 [employer]. This information is not intended to replace advice given to you by your health care provider. Make sure you discuss any questions you have with your health care provider.  

## 2014-01-14 ENCOUNTER — Ambulatory Visit: Payer: Medicaid Other | Admitting: Family Medicine

## 2014-02-22 ENCOUNTER — Emergency Department (HOSPITAL_COMMUNITY)
Admission: EM | Admit: 2014-02-22 | Discharge: 2014-02-23 | Disposition: A | Discharge status: Home / Self Care | Payer: Medicaid Other | Attending: Emergency Medicine | Admitting: Emergency Medicine

## 2014-02-22 ENCOUNTER — Encounter (HOSPITAL_COMMUNITY): Payer: Self-pay | Admitting: Emergency Medicine

## 2014-02-22 DIAGNOSIS — F329 Major depressive disorder, single episode, unspecified: Secondary | ICD-10-CM | POA: Insufficient documentation

## 2014-02-22 DIAGNOSIS — Z792 Long term (current) use of antibiotics: Secondary | ICD-10-CM | POA: Insufficient documentation

## 2014-02-22 DIAGNOSIS — Z3202 Encounter for pregnancy test, result negative: Secondary | ICD-10-CM | POA: Insufficient documentation

## 2014-02-22 DIAGNOSIS — R109 Unspecified abdominal pain: Secondary | ICD-10-CM | POA: Insufficient documentation

## 2014-02-22 DIAGNOSIS — R51 Headache: Secondary | ICD-10-CM | POA: Insufficient documentation

## 2014-02-22 DIAGNOSIS — R112 Nausea with vomiting, unspecified: Secondary | ICD-10-CM

## 2014-02-22 DIAGNOSIS — J45909 Unspecified asthma, uncomplicated: Secondary | ICD-10-CM | POA: Insufficient documentation

## 2014-02-22 DIAGNOSIS — Z79899 Other long term (current) drug therapy: Secondary | ICD-10-CM | POA: Insufficient documentation

## 2014-02-22 DIAGNOSIS — Z87891 Personal history of nicotine dependence: Secondary | ICD-10-CM | POA: Insufficient documentation

## 2014-02-22 NOTE — ED Notes (Signed)
Pt states she has been nauseated today and had some light headedness  Pt states she went to work and then started having vomiting  Pt states the emesis had streaks of blood in it and pt states she has a really bad headache

## 2014-02-23 LAB — URINALYSIS, ROUTINE W REFLEX MICROSCOPIC
BILIRUBIN URINE: NEGATIVE
GLUCOSE, UA: NEGATIVE mg/dL
Hgb urine dipstick: NEGATIVE
Ketones, ur: NEGATIVE mg/dL
Nitrite: NEGATIVE
Protein, ur: NEGATIVE mg/dL
SPECIFIC GRAVITY, URINE: 1.025 (ref 1.005–1.030)
UROBILINOGEN UA: 0.2 mg/dL (ref 0.0–1.0)
pH: 6 (ref 5.0–8.0)

## 2014-02-23 LAB — URINE MICROSCOPIC-ADD ON

## 2014-02-23 LAB — PREGNANCY, URINE: Preg Test, Ur: NEGATIVE

## 2014-02-23 MED ORDER — DIPHENHYDRAMINE HCL 50 MG/ML IJ SOLN
12.5000 mg | Freq: Once | INTRAMUSCULAR | Status: AC
  Administered 2014-02-23: 12.5 mg via INTRAVENOUS
  Filled 2014-02-23: qty 1

## 2014-02-23 MED ORDER — ONDANSETRON HCL 4 MG PO TABS: 4.0000 mg | ORAL_TABLET | Freq: Four times a day (QID) | ORAL | Status: DC

## 2014-02-23 MED ORDER — SODIUM CHLORIDE 0.9 % IV SOLN
Freq: Once | INTRAVENOUS | Status: AC
  Administered 2014-02-23: via INTRAVENOUS

## 2014-02-23 MED ORDER — METOCLOPRAMIDE HCL 5 MG/ML IJ SOLN
10.0000 mg | Freq: Once | INTRAMUSCULAR | Status: AC
  Administered 2014-02-23: 10 mg via INTRAVENOUS
  Filled 2014-02-23: qty 2

## 2014-02-23 NOTE — ED Provider Notes (Signed)
CSN: [id number]     Arrival date & time 02/22/14  2137 History   First MD Initiated Contact with Patient 02/22/14 2325     Chief Complaint  Patient presents with  . Headache  . Emesis     (Consider location/radiation/quality/duration/timing/severity/associated sxs/prior Treatment) Patient is a 23 y.o. female presenting with headaches and vomiting. The history is provided by the patient. No language interpreter was used.  Headache Pain location:  Frontal Chronicity:  New Similar to prior headaches: yes   Associated symptoms: abdominal pain, nausea and vomiting   Associated symptoms: no diarrhea, no fever, no myalgias, no neck stiffness and no photophobia   Associated symptoms comment:  She started vomiting earlier today and has had multiple episodes, some that was blood tinged. No fever, diarrhea, cough or abdominal distension. No sick contacts that she is aware of. She has mild abdominal discomfort. She was also having a mild headache similar to previous headaches that was gradual in onset and made worse with vomiting.  Emesis Associated symptoms: abdominal pain and headaches   Associated symptoms: no chills, no diarrhea and no myalgias     Past Medical History  Diagnosis Date  . Asthma   . Depression   . Frequent headaches    Past Surgical History  Procedure Laterality Date  . Tonsillectomy and adenoidectomy     Family History  Problem Relation Age of Onset  . Asthma    . Depression Mother   . Hypertension Mother   . Other      Multiple Cancers in Grandparents & Aunts/uncles (Brain, Lung, Cervix)  . Other      Heart Disease in Grandparents & Aunts/Uncles; Negative in 1st degree relatives  . Diabetes      in Grandparents & Aunts/Uncles - no 1st degree  . Hypertension      in Grandparents & Aunts/Uncles  . Stroke      Aunts/Uncles; no first degree  . Thyroid disease      Aunts/Uncles; no first degree   History  Substance Use Topics  . Smoking status: Former  Smoker -- 0.30 packs/day    Types: Cigarettes  . Smokeless tobacco: Not on file  . Alcohol Use: Yes     Comment: socially   OB History    No data available     Review of Systems  Constitutional: Negative for fever and chills.  Eyes: Negative for photophobia and visual disturbance.  Respiratory: Negative.   Cardiovascular: Negative.   Gastrointestinal: Positive for nausea, vomiting and abdominal pain. Negative for diarrhea.  Genitourinary: Negative.  Negative for dysuria and menstrual problem.  Musculoskeletal: Negative.  Negative for myalgias and neck stiffness.  Skin: Negative.  Negative for rash.  Neurological: Positive for headaches. Negative for syncope, weakness and light-headedness.      Allergies  Review of patient's allergies indicates no known allergies.  Home Medications   Prior to Admission medications   Medication Sig Start Date End Date Taking? Authorizing Provider  albuterol (PROVENTIL HFA;VENTOLIN HFA) 108 (90 BASE) MCG/ACT inhaler Inhale 2 puffs into the lungs every 6 (six) hours as needed for wheezing. 04/09/13  Yes Gerda Diss, DO  dicyclomine (BENTYL) 20 MG tablet Take 1 tablet (20 mg total) by mouth 2 (two) times daily. Patient not taking: Reported on 02/22/2014 01/05/14   Elwyn Lade, PA-C  doxycycline (VIBRA-TABS) 100 MG tablet Take 1 tablet (100 mg total) by mouth 2 (two) times daily. Patient not taking: Reported on 02/22/2014 04/29/13   Juanda Bond  Paulla Fore, DO  FLUoxetine (PROZAC) 20 MG tablet Take 1 tablet (20 mg total) by mouth daily. Patient not taking: Reported on 02/22/2014 06/05/13   Gerda Diss, DO  metroNIDAZOLE (FLAGYL) 500 MG tablet Take 1 tablet (500 mg total) by mouth 3 (three) times daily. Patient not taking: Reported on 02/22/2014 11/12/13   Renee A Kuneff, DO  ondansetron (ZOFRAN) 4 MG tablet Take 1 tablet (4 mg total) by mouth every 6 (six) hours. Patient not taking: Reported on 02/22/2014 01/05/14   Elwyn Lade, PA-C   BP  135/77 mmHg  Pulse 88  Temp(Src) 98.9 F (37.2 C) (Oral)  Resp 20  SpO2 100%  LMP 02/06/2014 (Approximate) Physical Exam  Constitutional: She is oriented to person, place, and time. She appears well-developed and well-nourished.  HENT:  Head: Normocephalic.  Neck: Normal range of motion. Neck supple.  Cardiovascular: Normal rate and regular rhythm.   Pulmonary/Chest: Effort normal and breath sounds normal. She has no wheezes. She has no rales.  Abdominal: Soft. Bowel sounds are normal. There is tenderness. There is no rebound and no guarding.  Mild left sided abdominal tenderness. No guarding or rebound. No distension on exam limited by obesity.   Musculoskeletal: Normal range of motion.  Neurological: She is alert and oriented to person, place, and time. Coordination normal.  Skin: Skin is warm and dry. No rash noted.  Psychiatric: She has a normal mood and affect.    ED Course  Procedures (including critical care time) Labs Review Labs Reviewed  URINALYSIS, ROUTINE W REFLEX MICROSCOPIC    Imaging Review No results found.   EKG Interpretation None      MDM   Final diagnoses:  None    1. Nausea and vomiting 2. Nonspecific headache   Headache pain is better. History of similar headaches with no neurologic deficits on exam tonight. Doubt IC bleed or mass, doubt serious headache. No further vomiting. UA negative, does not appear concentrated. Plan: pregnancy and PO challenge and re-evaluate.  No further vomiting on PO challenge. She is well appearing and states she is ready for discharge home. Stable for discharge.   Dewaine Oats, PA-C 02/23/14 0157  Orlie Dakin, MD 02/23/14 [phone]

## 2014-02-23 NOTE — ED Notes (Signed)
PA-C at bedside 

## 2014-02-23 NOTE — Discharge Instructions (Signed)
Nausea and Vomiting Nausea is a sick feeling that often comes before throwing up (vomiting). Vomiting is a reflex where stomach contents come out of your mouth. Vomiting can cause severe loss of body fluids (dehydration). Children and elderly adults can become dehydrated quickly, especially if they also have diarrhea. Nausea and vomiting are symptoms of a condition or disease. It is important to find the cause of your symptoms. CAUSES   Direct irritation of the stomach lining. This irritation can result from increased acid production (gastroesophageal reflux disease), infection, food poisoning, taking certain medicines (such as nonsteroidal anti-inflammatory drugs), alcohol use, or tobacco use.  Signals from the brain.These signals could be caused by a headache, heat exposure, an inner ear disturbance, increased pressure in the brain from injury, infection, a tumor, or a concussion, pain, emotional stimulus, or metabolic problems.  An obstruction in the gastrointestinal tract (bowel obstruction).  Illnesses such as diabetes, hepatitis, gallbladder problems, appendicitis, kidney problems, cancer, sepsis, atypical symptoms of a heart attack, or eating disorders.  Medical treatments such as chemotherapy and radiation.  Receiving medicine that makes you sleep (general anesthetic) during surgery. DIAGNOSIS Your caregiver may ask for tests to be done if the problems do not improve after a few days. Tests may also be done if symptoms are severe or if the reason for the nausea and vomiting is not clear. Tests may include:  Urine tests.  Blood tests.  Stool tests.  Cultures (to look for evidence of infection).  X-rays or other imaging studies. Test results can help your caregiver make decisions about treatment or the need for additional tests. TREATMENT You need to stay well hydrated. Drink frequently but in small amounts.You may wish to drink water, sports drinks, clear broth, or eat frozen  ice pops or gelatin dessert to help stay hydrated.When you eat, eating slowly may help prevent nausea.There are also some antinausea medicines that may help prevent nausea. HOME CARE INSTRUCTIONS   Take all medicine as directed by your caregiver.  If you do not have an appetite, do not force yourself to eat. However, you must continue to drink fluids.  If you have an appetite, eat a normal diet unless your caregiver tells you differently.  Eat a variety of complex carbohydrates (rice, wheat, potatoes, bread), lean meats, yogurt, fruits, and vegetables.  Avoid high-fat foods because they are more difficult to digest.  Drink enough water and fluids to keep your urine clear or pale yellow.  If you are dehydrated, ask your caregiver for specific rehydration instructions. Signs of dehydration may include:  Severe thirst.  Dry lips and mouth.  Dizziness.  Dark urine.  Decreasing urine frequency and amount.  Confusion.  Rapid breathing or pulse. SEEK IMMEDIATE MEDICAL CARE IF:   You have blood or brown flecks (like coffee grounds) in your vomit.  You have black or bloody stools.  You have a severe headache or stiff neck.  You are confused.  You have severe abdominal pain.  You have chest pain or trouble breathing.  You do not urinate at least once every 8 hours.  You develop cold or clammy skin.  You continue to vomit for longer than 24 to 48 hours.  You have a fever. MAKE SURE YOU:   Understand these instructions.  Will watch your condition.  Will get help right away if you are not doing well or get worse. Document Released: 02/12/2005 Document Revised: 05/07/2011 Document Reviewed: 07/12/2010 [employer] Patient Information 2015 [employer]. This information is not intended   to replace advice given to you by your health care provider. Make sure you discuss any questions you have with your health care provider. General Headache Without Cause A headache is pain  or discomfort felt around the head or neck area. The specific cause of a headache may not be found. There are many causes and types of headaches. A few common ones are:  Tension headaches.  Migraine headaches.  Cluster headaches.  Chronic daily headaches. HOME CARE INSTRUCTIONS   Keep all follow-up appointments with your caregiver or any specialist referral.  Only take over-the-counter or prescription medicines for pain or discomfort as directed by your caregiver.  Lie down in a dark, quiet room when you have a headache.  Keep a headache journal to find out what may trigger your migraine headaches. For example, write down:  What you eat and drink.  How much sleep you get.  Any change to your diet or medicines.  Try massage or other relaxation techniques.  Put ice packs or heat on the head and neck. Use these 3 to 4 times per day for 15 to 20 minutes each time, or as needed.  Limit stress.  Sit up straight, and do not tense your muscles.  Quit smoking if you smoke.  Limit alcohol use.  Decrease the amount of caffeine you drink, or stop drinking caffeine.  Eat and sleep on a regular schedule.  Get 7 to 9 hours of sleep, or as recommended by your caregiver.  Keep lights dim if bright lights bother you and make your headaches worse. SEEK MEDICAL CARE IF:   You have problems with the medicines you were prescribed.  Your medicines are not working.  You have a change from the usual headache.  You have nausea or vomiting. SEEK IMMEDIATE MEDICAL CARE IF:   Your headache becomes severe.  You have a fever.  You have a stiff neck.  You have loss of vision.  You have muscular weakness or loss of muscle control.  You start losing your balance or have trouble walking.  You feel faint or pass out.  You have severe symptoms that are different from your first symptoms. MAKE SURE YOU:   Understand these instructions.  Will watch your condition.  Will get help  right away if you are not doing well or get worse. Document Released: 02/12/2005 Document Revised: 05/07/2011 Document Reviewed: 02/28/2011 [facility] Patient Information 2015 [place], [place]. This information is not intended to replace advice given to you by your health care provider. Make sure you discuss any questions you have with your health care provider.

## 2014-05-04 ENCOUNTER — Ambulatory Visit: Payer: Medicaid Other | Admitting: Family Medicine

## 2014-05-24 ENCOUNTER — Ambulatory Visit: Payer: Medicaid Other | Admitting: Family Medicine

## 2014-05-24 ENCOUNTER — Encounter: Payer: Self-pay | Admitting: Family Medicine

## 2014-05-24 NOTE — Progress Notes (Deleted)
Patient ID: Roberta Rodriguez, female   DOB: 1990/09/28, 24 y.o.   MRN: [id number] Subjective:   CC: ***  HPI:   1. ***  Review of Systems - Per HPI. Additionally, ***  ***PMH, FH, or SH - headache, low back pain, mood disorder,  Smoking status: ***    Objective:  Physical Exam There were no vitals taken for this visit. GEN: ***     Assessment:     Roberta Rodriguez is a 24 y.o. female with h/o *** here for ***    Plan:     # See problem list and after visit summary for problem-specific plans. ***  # Health Maintenance: ***  Follow-up: Follow up in *** for ***.   Hilton Sinclair, MD [place]   This encounter was created in error - please disregard.

## 2014-05-25 ENCOUNTER — Encounter: Payer: Self-pay | Admitting: Family Medicine

## 2014-05-25 ENCOUNTER — Ambulatory Visit (INDEPENDENT_AMBULATORY_CARE_PROVIDER_SITE_OTHER): Payer: Self-pay | Admitting: Family Medicine

## 2014-05-25 VITALS — BP 133/84 | HR 114 | Temp 99.5°F | Ht 66.0 in | Wt 330.4 lb

## 2014-05-25 DIAGNOSIS — J029 Acute pharyngitis, unspecified: Secondary | ICD-10-CM

## 2014-05-25 DIAGNOSIS — H6980 Other specified disorders of Eustachian tube, unspecified ear: Secondary | ICD-10-CM | POA: Insufficient documentation

## 2014-05-25 DIAGNOSIS — H6990 Unspecified Eustachian tube disorder, unspecified ear: Secondary | ICD-10-CM | POA: Insufficient documentation

## 2014-05-25 DIAGNOSIS — H6982 Other specified disorders of Eustachian tube, left ear: Secondary | ICD-10-CM

## 2014-05-25 LAB — POCT RAPID STREP A (OFFICE): Rapid Strep A Screen: NEGATIVE

## 2014-05-25 MED ORDER — IPRATROPIUM BROMIDE 0.06 % NA SOLN: 2.0000 | Freq: Four times a day (QID) | NASAL | Status: DC

## 2014-05-25 NOTE — Progress Notes (Signed)
Patient ID: Roberta Rodriguez, female   DOB: Jan 12, 1991, 24 y.o.   MRN: [id number]   HPI  Patient presents today for sore throat, earache, and malaise  Patient explains that she began having these symptoms about one week ago. She ascribes malaise, left-sided ear pain, left-sided sore throat, congestion, coryza, and very mild cough. She denies any dyspnea or chest pain. She is eating and drinking normally.  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  She had her tonsils removed when she was in the fifth grade.  ROS: Per HPI  Objective: BP 133/84 mmHg  Pulse 114  Temp(Src) 99.5 F (37.5 C) (Oral)  Ht 5\' 6"  (1.676 m)  Wt 330 lb 6.4 oz (149.868 kg)  BMI 53.35 kg/m2  LMP 04/03/2014 Gen: NAD, alert, cooperative with exam HEENT: NCAT, oropharynx clear, TMs clear bilaterally, nares with swollen boggy blue mucosa and large turbinates, tender lymphadenopathy in bilateral cervical nodes CV: RRR, good S1/S2, no murmur Resp: CTABL, no wheezes, non-labored Ext: No edema, warm  Neuro: Alert and oriented, No gross deficits  Assessment and plan:  Eustachian tube dysfunction L sided ear pain assoc with a cold, no signs of AOM Supportive care atrovent nasal spray for congestion and hopefully reduce swelling from drainage reasons for return given, note for work written.     Orders Placed This Encounter  Procedures  . Rapid Strep A    Meds ordered this encounter  Medications  . ipratropium (ATROVENT) 0.06 % nasal spray    Sig: Place 2 sprays into both nostrils 4 (four) times daily.    Dispense:  15 mL    Refill:  1

## 2014-05-25 NOTE — Progress Notes (Signed)
Error

## 2014-05-25 NOTE — Patient Instructions (Signed)
Great to meet you!  I am sorry you are sick, you should start to feel better in 3-5 days  Get plenty of fluids, rest, and wash your hands all the time!  Upper Respiratory Infection, Adult An upper respiratory infection (URI) is also sometimes known as the common cold. The upper respiratory tract includes the nose, sinuses, throat, trachea, and bronchi. Bronchi are the airways leading to the lungs. Most people improve within 1 week, but symptoms can last up to 2 weeks. A residual cough may last even longer.  CAUSES Many different viruses can infect the tissues lining the upper respiratory tract. The tissues become irritated and inflamed and often become very moist. Mucus production is also common. A cold is contagious. You can easily spread the virus to others by oral contact. This includes kissing, sharing a glass, coughing, or sneezing. Touching your mouth or nose and then touching a surface, which is then touched by another person, can also spread the virus. SYMPTOMS  Symptoms typically develop 1 to 3 days after you come in contact with a cold virus. Symptoms vary from person to person. They may include:  Runny nose.  Sneezing.  Nasal congestion.  Sinus irritation.  Sore throat.  Loss of voice (laryngitis).  Cough.  Fatigue.  Muscle aches.  Loss of appetite.  Headache.  Low-grade fever. DIAGNOSIS  You might diagnose your own cold based on familiar symptoms, since most people get a cold 2 to 3 times a year. Your caregiver can confirm this based on your exam. Most importantly, your caregiver can check that your symptoms are not due to another disease such as strep throat, sinusitis, pneumonia, asthma, or epiglottitis. Blood tests, throat tests, and X-rays are not necessary to diagnose a common cold, but they may sometimes be helpful in excluding other more serious diseases. Your caregiver will decide if any further tests are required. RISKS AND COMPLICATIONS  You may be at risk  for a more severe case of the common cold if you smoke cigarettes, have chronic heart disease (such as heart failure) or lung disease (such as asthma), or if you have a weakened immune system. The very young and very old are also at risk for more serious infections. Bacterial sinusitis, middle ear infections, and bacterial pneumonia can complicate the common cold. The common cold can worsen asthma and chronic obstructive pulmonary disease (COPD). Sometimes, these complications can require emergency medical care and may be life-threatening. PREVENTION  The best way to protect against getting a cold is to practice good hygiene. Avoid oral or hand contact with people with cold symptoms. Wash your hands often if contact occurs. There is no clear evidence that vitamin C, vitamin E, echinacea, or exercise reduces the chance of developing a cold. However, it is always recommended to get plenty of rest and practice good nutrition. TREATMENT  Treatment is directed at relieving symptoms. There is no cure. Antibiotics are not effective, because the infection is caused by a virus, not by bacteria. Treatment may include:  Increased fluid intake. Sports drinks offer valuable electrolytes, sugars, and fluids.  Breathing heated mist or steam (vaporizer or shower).  Eating chicken soup or other clear broths, and maintaining good nutrition.  Getting plenty of rest.  Using gargles or lozenges for comfort.  Controlling fevers with ibuprofen or acetaminophen as directed by your caregiver.  Increasing usage of your inhaler if you have asthma. Zinc gel and zinc lozenges, taken in the first 24 hours of the common cold, can shorten  the duration and lessen the severity of symptoms. Pain medicines may help with fever, muscle aches, and throat pain. A variety of non-prescription medicines are available to treat congestion and runny nose. Your caregiver can make recommendations and may suggest nasal or lung inhalers for other  symptoms.  HOME CARE INSTRUCTIONS   Only take over-the-counter or prescription medicines for pain, discomfort, or fever as directed by your caregiver.  Use a warm mist humidifier or inhale steam from a shower to increase air moisture. This may keep secretions moist and make it easier to breathe.  Drink enough water and fluids to keep your urine clear or pale yellow.  Rest as needed.  Return to work when your temperature has returned to normal or as your caregiver advises. You may need to stay home longer to avoid infecting others. You can also use a face mask and careful hand washing to prevent spread of the virus. SEEK MEDICAL CARE IF:   After the first few days, you feel you are getting worse rather than better.  You need your caregiver's advice about medicines to control symptoms.  You develop chills, worsening shortness of breath, or brown or red sputum. These may be signs of pneumonia.  You develop yellow or brown nasal discharge or pain in the face, especially when you bend forward. These may be signs of sinusitis.  You develop a fever, swollen neck glands, pain with swallowing, or white areas in the back of your throat. These may be signs of strep throat. SEEK IMMEDIATE MEDICAL CARE IF:   You have a fever.  You develop severe or persistent headache, ear pain, sinus pain, or chest pain.  You develop wheezing, a prolonged cough, cough up blood, or have a change in your usual mucus (if you have chronic lung disease).  You develop sore muscles or a stiff neck. Document Released: 08/08/2000 Document Revised: 05/07/2011 Document Reviewed: 05/20/2013 [facility] Patient Information 2015 [place], [place]. This information is not intended to replace advice given to you by your health care provider. Make sure you discuss any questions you have with your health care provider.

## 2014-05-25 NOTE — Assessment & Plan Note (Signed)
L sided ear pain assoc with a cold, no signs of AOM Supportive care atrovent nasal spray for congestion and hopefully reduce swelling from drainage reasons for return given, note for work written.

## 2014-07-24 ENCOUNTER — Encounter (HOSPITAL_COMMUNITY): Payer: Self-pay | Admitting: Emergency Medicine

## 2014-07-24 ENCOUNTER — Emergency Department (HOSPITAL_COMMUNITY)
Admission: EM | Admit: 2014-07-24 | Discharge: 2014-07-24 | Disposition: A | Discharge status: Home / Self Care | Payer: Medicaid Other | Attending: Emergency Medicine | Admitting: Emergency Medicine

## 2014-07-24 DIAGNOSIS — F329 Major depressive disorder, single episode, unspecified: Secondary | ICD-10-CM | POA: Insufficient documentation

## 2014-07-24 DIAGNOSIS — Z79899 Other long term (current) drug therapy: Secondary | ICD-10-CM | POA: Insufficient documentation

## 2014-07-24 DIAGNOSIS — J45901 Unspecified asthma with (acute) exacerbation: Secondary | ICD-10-CM | POA: Insufficient documentation

## 2014-07-24 DIAGNOSIS — Z720 Tobacco use: Secondary | ICD-10-CM | POA: Insufficient documentation

## 2014-07-24 MED ORDER — ALBUTEROL SULFATE HFA 108 (90 BASE) MCG/ACT IN AERS: 1.0000 | INHALATION_SPRAY | Freq: Four times a day (QID) | RESPIRATORY_TRACT | Status: DC | PRN

## 2014-07-24 MED ORDER — AZITHROMYCIN 250 MG PO TABS: ORAL_TABLET | ORAL | Status: DC

## 2014-07-24 MED ORDER — IPRATROPIUM-ALBUTEROL 0.5-2.5 (3) MG/3ML IN SOLN
3.0000 mL | Freq: Once | RESPIRATORY_TRACT | Status: AC
  Administered 2014-07-24: 3 mL via RESPIRATORY_TRACT
  Filled 2014-07-24: qty 3

## 2014-07-24 MED ORDER — AEROCHAMBER PLUS W/MASK MISC: Status: AC

## 2014-07-24 MED ORDER — PREDNISONE 20 MG PO TABS: ORAL_TABLET | ORAL | Status: DC

## 2014-07-24 NOTE — ED Provider Notes (Signed)
CSN: [id number]     Arrival date & time 07/24/14  7829 History   First MD Initiated Contact with Patient 07/24/14 [phone]     Chief Complaint  Patient presents with  . Cough  . Nasal Congestion     (Consider location/radiation/quality/duration/timing/severity/associated sxs/prior Treatment) HPI The patient poor she's had a cough for about a week. Yellow sputum. Some chest pain with cough but not severe. This patient does not think she's had a fever but has not been measuring it. She was diagnosed with asthma about 4 years ago. He uses an inhaler as needed. For the past week she's had to use the inhaler multiple times. There is been a small amount of nasal congestion and discharge. No sore throat or difficulty swallowing. Past Medical History  Diagnosis Date  . Asthma   . Depression   . Frequent headaches    Past Surgical History  Procedure Laterality Date  . Tonsillectomy and adenoidectomy     Family History  Problem Relation Age of Onset  . Asthma    . Depression Mother   . Hypertension Mother   . Other      Multiple Cancers in Grandparents & Aunts/uncles (Brain, Lung, Cervix)  . Other      Heart Disease in Grandparents & Aunts/Uncles; Negative in 1st degree relatives  . Diabetes      in Grandparents & Aunts/Uncles - no 1st degree  . Hypertension      in Grandparents & Aunts/Uncles  . Stroke      Aunts/Uncles; no first degree  . Thyroid disease      Aunts/Uncles; no first degree   History  Substance Use Topics  . Smoking status: Current Every Day Smoker -- 0.30 packs/day    Types: Cigarettes  . Smokeless tobacco: Not on file  . Alcohol Use: Yes     Comment: socially   OB History    No data available     Review of Systems 10 Systems reviewed and are negative for acute change except as noted in the HPI.    Allergies  Review of patient's allergies indicates no known allergies.  Home Medications   Prior to Admission medications   Medication Sig Start Date End  Date Taking? Authorizing Provider  albuterol (PROVENTIL HFA;VENTOLIN HFA) 108 (90 BASE) MCG/ACT inhaler Inhale 2 puffs into the lungs every 6 (six) hours as needed for wheezing. 04/09/13  Yes Gerda Diss, DO  OVER THE COUNTER MEDICATION Take 1-2 tablets by mouth every 4 (four) hours as needed (congestion.). Decongestant.   Yes Historical Provider, MD  albuterol (PROVENTIL HFA;VENTOLIN HFA) 108 (90 BASE) MCG/ACT inhaler Inhale 1-2 puffs into the lungs every 6 (six) hours as needed for wheezing or shortness of breath. 07/24/14   Charlesetta Shanks, MD  azithromycin (ZITHROMAX Z-PAK) 250 MG tablet 2 po day one, then 1 daily x 4 days 07/24/14   Charlesetta Shanks, MD  dicyclomine (BENTYL) 20 MG tablet Take 1 tablet (20 mg total) by mouth 2 (two) times daily. Patient not taking: Reported on 02/22/2014 01/05/14   Cleatrice Burke, PA-C  FLUoxetine (PROZAC) 20 MG tablet Take 1 tablet (20 mg total) by mouth daily. Patient not taking: Reported on 02/22/2014 06/05/13   Gerda Diss, DO  ipratropium (ATROVENT) 0.06 % nasal spray Place 2 sprays into both nostrils 4 (four) times daily. Patient not taking: Reported on 07/24/2014 05/25/14   Timmothy Euler, MD  predniSONE (DELTASONE) 20 MG tablet 3 tabs po day one, then 2 tabs  daily x 4 days 07/24/14   Charlesetta Shanks, MD  Spacer/Aero-Holding Chambers (AEROCHAMBER PLUS WITH MASK) inhaler Use as instructed 07/24/14   Charlesetta Shanks, MD   BP 162/95 mmHg  Pulse 88  Temp(Src) 98.5 F (36.9 C) (Oral)  Resp 16  Ht 5\' 6"  (1.676 m)  Wt 320 lb (145.151 kg)  BMI 51.67 kg/m2  SpO2 98%  LMP 07/19/2014 Physical Exam  Constitutional: She is oriented to person, place, and time. She appears well-developed and well-nourished.  Patient is morbidly obese. She is alert and nontoxic. She has occasional harsh cough paroxysm. No active respiratory distress.  HENT:  Head: Normocephalic and atraumatic.  Right Ear: External ear normal.  Left Ear: External ear normal.  Mouth/Throat:  Oropharynx is clear and moist. No oropharyngeal exudate.  Bilateral TMs normal. Nasal mucosa boggy.  Eyes: EOM are normal. Pupils are equal, round, and reactive to light.  Neck: Neck supple.  Cardiovascular: Normal rate, regular rhythm, normal heart sounds and intact distal pulses.   Pulmonary/Chest: Effort normal.  Bilateral expiratory wheezes throughout the lung fields. Mildly diminished air flow to the bases. No rhonchi no rail. With inspiration the patient has harsh cough proximal. No respiratory distress at rest.  Abdominal: Soft. Bowel sounds are normal. She exhibits no distension. There is no tenderness.  Musculoskeletal: Normal range of motion.  Neurological: She is alert and oriented to person, place, and time. She has normal strength. Coordination normal. GCS eye subscore is 4. GCS verbal subscore is 5. GCS motor subscore is 6.  Skin: Skin is warm, dry and intact.  Psychiatric: She has a normal mood and affect.    ED Course  Procedures (including critical care time) Labs Review Labs Reviewed - No data to display  Imaging Review No results found.   EKG Interpretation None      MDM   Final diagnoses:  Asthmatic bronchitis with acute exacerbation      Charlesetta Shanks, MD 07/24/14 [phone]

## 2014-07-24 NOTE — ED Notes (Signed)
Pt reports that she has had nasal congestion and cough x1 week. Cough increasing with yellow sputum. Pt is a smoke with a hx of asthma and states that she has had to increase her use of her inhaler. Pt with clear lung sounds throughout at this time.

## 2014-07-24 NOTE — Discharge Instructions (Signed)
Asthma Asthma is a recurring condition in which the airways tighten and narrow. Asthma can make it difficult to breathe. It can cause coughing, wheezing, and shortness of breath. Asthma episodes, also called asthma attacks, range from minor to life-threatening. Asthma cannot be cured, but medicines and lifestyle changes can help control it. CAUSES Asthma is believed to be caused by inherited (genetic) and environmental factors, but its exact cause is unknown. Asthma may be triggered by allergens, lung infections, or irritants in the air. Asthma triggers are different for each person. Common triggers include:   Animal dander.  Dust mites.  Cockroaches.  Pollen from trees or grass.  Mold.  Smoke.  Air pollutants such as dust, household cleaners, hair sprays, aerosol sprays, paint fumes, strong chemicals, or strong odors.  Cold air, weather changes, and winds (which increase molds and pollens in the air).  Strong emotional expressions such as crying or laughing hard.  Stress.  Certain medicines (such as aspirin) or types of drugs (such as beta-blockers).  Sulfites in foods and drinks. Foods and drinks that may contain sulfites include dried fruit, potato chips, and sparkling grape juice.  Infections or inflammatory conditions such as the flu, a cold, or an inflammation of the nasal membranes (rhinitis).  Gastroesophageal reflux disease (GERD).  Exercise or strenuous activity. SYMPTOMS Symptoms may occur immediately after asthma is triggered or many hours later. Symptoms include:  Wheezing.  Excessive nighttime or early morning coughing.  Frequent or severe coughing with a common cold.  Chest tightness.  Shortness of breath. DIAGNOSIS  The diagnosis of asthma is made by a review of your medical history and a physical exam. Tests may also be performed. These may include:  Lung function studies. These tests show how much air you breathe in and out.  Allergy  tests.  Imaging tests such as X-rays. TREATMENT  Asthma cannot be cured, but it can usually be controlled. Treatment involves identifying and avoiding your asthma triggers. It also involves medicines. There are 2 classes of medicine used for asthma treatment:   Controller medicines. These prevent asthma symptoms from occurring. They are usually taken every day.  Reliever or rescue medicines. These quickly relieve asthma symptoms. They are used as needed and provide short-term relief. Your health care provider will help you create an asthma action plan. An asthma action plan is a written plan for managing and treating your asthma attacks. It includes a list of your asthma triggers and how they may be avoided. It also includes information on when medicines should be taken and when their dosage should be changed. An action plan may also involve the use of a device called a peak flow meter. A peak flow meter measures how well the lungs are working. It helps you monitor your condition. HOME CARE INSTRUCTIONS   Take medicines only as directed by your health care provider. Speak with your health care provider if you have questions about how or when to take the medicines.  Use a peak flow meter as directed by your health care provider. Record and keep track of readings.  Understand and use the action plan to help minimize or stop an asthma attack without needing to seek medical care.  Control your home environment in the following ways to help prevent asthma attacks:  Do not smoke. Avoid being exposed to secondhand smoke.  Change your heating and air conditioning filter regularly.  Limit your use of fireplaces and wood stoves.  Get rid of pests (such as roaches and  mice) and their droppings.  Throw away plants if you see mold on them.  Clean your floors and dust regularly. Use unscented cleaning products.  Try to have someone else vacuum for you regularly. Stay out of rooms while they are  being vacuumed and for a short while afterward. If you vacuum, use a dust mask from a hardware store, a double-layered or microfilter vacuum cleaner bag, or a vacuum cleaner with a HEPA filter.  Replace carpet with wood, tile, or vinyl flooring. Carpet can trap dander and dust.  Use allergy-proof pillows, mattress covers, and box spring covers.  Wash bed sheets and blankets every week in hot water and dry them in a dryer.  Use blankets that are made of polyester or cotton.  Clean bathrooms and kitchens with bleach. If possible, have someone repaint the walls in these rooms with mold-resistant paint. Keep out of the rooms that are being cleaned and painted.  Wash hands frequently. SEEK MEDICAL CARE IF:   You have wheezing, shortness of breath, or a cough even if taking medicine to prevent attacks.  The colored mucus you cough up (sputum) is thicker than usual.  Your sputum changes from clear or white to yellow, green, gray, or bloody.  You have any problems that may be related to the medicines you are taking (such as a rash, itching, swelling, or trouble breathing).  You are using a reliever medicine more than 2-3 times per week.  Your peak flow is still at 50-79% of your personal best after following your action plan for 1 hour.  You have a fever. SEEK IMMEDIATE MEDICAL CARE IF:   You seem to be getting worse and are unresponsive to treatment during an asthma attack.  You are short of breath even at rest.  You get short of breath when doing very little physical activity.  You have difficulty eating, drinking, or talking due to asthma symptoms.  You develop chest pain.  You develop a fast heartbeat.  You have a bluish color to your lips or fingernails.  You are light-headed, dizzy, or faint.  Your peak flow is less than 50% of your personal best. MAKE SURE YOU:   Understand these instructions.  Will watch your condition.  Will get help right away if you are not  doing well or get worse. Document Released: 02/12/2005 Document Revised: 06/29/2013 Document Reviewed: 09/11/2012 [facility] Patient Information 2015 [place], [place]. This information is not intended to replace advice given to you by your health care provider. Make sure you discuss any questions you have with your health care provider. Acute Bronchitis Bronchitis is inflammation of the airways that extend from the windpipe into the lungs (bronchi). The inflammation often causes mucus to develop. This leads to a cough, which is the most common symptom of bronchitis.  In acute bronchitis, the condition usually develops suddenly and goes away over time, usually in a couple weeks. Smoking, allergies, and asthma can make bronchitis worse. Repeated episodes of bronchitis may cause further lung problems.  CAUSES Acute bronchitis is most often caused by the same virus that causes a cold. The virus can spread from person to person (contagious) through coughing, sneezing, and touching contaminated objects. SIGNS AND SYMPTOMS   Cough.   Fever.   Coughing up mucus.   Body aches.   Chest congestion.   Chills.   Shortness of breath.   Sore throat.  DIAGNOSIS  Acute bronchitis is usually diagnosed through a physical exam. Your health care provider will also  ask you questions about your medical history. Tests, such as chest X-rays, are sometimes done to rule out other conditions.  TREATMENT  Acute bronchitis usually goes away in a couple weeks. Oftentimes, no medical treatment is necessary. Medicines are sometimes given for relief of fever or cough. Antibiotic medicines are usually not needed but may be prescribed in certain situations. In some cases, an inhaler may be recommended to help reduce shortness of breath and control the cough. A cool mist vaporizer may also be used to help thin bronchial secretions and make it easier to clear the chest.  HOME CARE INSTRUCTIONS  Get plenty of rest.    Drink enough fluids to keep your urine clear or pale yellow (unless you have a medical condition that requires fluid restriction). Increasing fluids may help thin your respiratory secretions (sputum) and reduce chest congestion, and it will prevent dehydration.   Take medicines only as directed by your health care provider.  If you were prescribed an antibiotic medicine, finish it all even if you start to feel better.  Avoid smoking and secondhand smoke. Exposure to cigarette smoke or irritating chemicals will make bronchitis worse. If you are a smoker, consider using nicotine gum or skin patches to help control withdrawal symptoms. Quitting smoking will help your lungs heal faster.   Reduce the chances of another bout of acute bronchitis by washing your hands frequently, avoiding people with cold symptoms, and trying not to touch your hands to your mouth, nose, or eyes.   Keep all follow-up visits as directed by your health care provider.  SEEK MEDICAL CARE IF: Your symptoms do not improve after 1 week of treatment.  SEEK IMMEDIATE MEDICAL CARE IF:  You develop an increased fever or chills.   You have chest pain.   You have severe shortness of breath.  You have bloody sputum.   You develop dehydration.  You faint or repeatedly feel like you are going to pass out.  You develop repeated vomiting.  You develop a severe headache. MAKE SURE YOU:   Understand these instructions.  Will watch your condition.  Will get help right away if you are not doing well or get worse. Document Released: 03/22/2004 Document Revised: 06/29/2013 Document Reviewed: 08/05/2012 [facility] Patient Information 2015 [place], [place]. This information is not intended to replace advice given to you by your health care provider. Make sure you discuss any questions you have with your health care provider.

## 2014-10-18 ENCOUNTER — Ambulatory Visit (INDEPENDENT_AMBULATORY_CARE_PROVIDER_SITE_OTHER): Payer: Self-pay | Admitting: *Deleted

## 2014-10-18 DIAGNOSIS — Z111 Encounter for screening for respiratory tuberculosis: Secondary | ICD-10-CM

## 2014-10-18 NOTE — Progress Notes (Signed)
   PPD placed Left Forearm.  Pt to return 10/20/14 for reading.  Pt tolerated intradermal injection. Derl Barrow, RN

## 2014-10-20 ENCOUNTER — Ambulatory Visit (INDEPENDENT_AMBULATORY_CARE_PROVIDER_SITE_OTHER): Payer: Self-pay | Admitting: *Deleted

## 2014-10-20 ENCOUNTER — Encounter: Payer: Self-pay | Admitting: *Deleted

## 2014-10-20 ENCOUNTER — Telehealth: Payer: Self-pay | Admitting: Family Medicine

## 2014-10-20 DIAGNOSIS — Z7689 Persons encountering health services in other specified circumstances: Secondary | ICD-10-CM

## 2014-10-20 DIAGNOSIS — Z111 Encounter for screening for respiratory tuberculosis: Secondary | ICD-10-CM

## 2014-10-20 LAB — TB SKIN TEST
INDURATION: 0 mm
TB Skin Test: NEGATIVE

## 2014-10-20 NOTE — Telephone Encounter (Signed)
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 Needs proof faxed to job so nurse can read it Fax  [place]: Marcie Bal

## 2014-10-20 NOTE — Progress Notes (Signed)
   PPD Reading Note PPD read and results entered in [employer]. Result: 0 mm induration. Interpretation: Negative If test not read within 48-72 hours of initial placement, patient advised to repeat in other arm 1-3 weeks after this test. Allergic reaction: no  Martin, Tamika L, RN  

## 2014-10-20 NOTE — Telephone Encounter (Signed)
Advised patient that she would have to come in to sign release of information to fax patient information to her job.  Patient advised she still can have TB test read tomorrow before 9:30 AM or she will need a repeat.  Derl Barrow, RN

## 2015-09-01 ENCOUNTER — Other Ambulatory Visit (HOSPITAL_COMMUNITY)
Admission: RE | Admit: 2015-09-01 | Discharge: 2015-09-01 | Disposition: A | Discharge status: Home / Self Care | Payer: Medicaid Other | Source: Ambulatory Visit | Attending: Family Medicine | Admitting: Family Medicine

## 2015-09-01 ENCOUNTER — Other Ambulatory Visit: Payer: Self-pay

## 2015-09-01 DIAGNOSIS — Z01419 Encounter for gynecological examination (general) (routine) without abnormal findings: Secondary | ICD-10-CM | POA: Insufficient documentation

## 2015-09-05 LAB — CYTOLOGY - PAP

## 2016-02-13 ENCOUNTER — Encounter (HOSPITAL_COMMUNITY): Payer: Self-pay | Admitting: Family Medicine

## 2016-02-13 ENCOUNTER — Emergency Department (HOSPITAL_COMMUNITY)
Admission: EM | Admit: 2016-02-13 | Discharge: 2016-02-14 | Disposition: A | Discharge status: Home / Self Care | Payer: Medicaid Other | Attending: Emergency Medicine | Admitting: Emergency Medicine

## 2016-02-13 DIAGNOSIS — J45909 Unspecified asthma, uncomplicated: Secondary | ICD-10-CM | POA: Insufficient documentation

## 2016-02-13 DIAGNOSIS — M545 Low back pain: Secondary | ICD-10-CM | POA: Insufficient documentation

## 2016-02-13 DIAGNOSIS — Z5321 Procedure and treatment not carried out due to patient leaving prior to being seen by health care provider: Secondary | ICD-10-CM | POA: Insufficient documentation

## 2016-02-13 DIAGNOSIS — Z87891 Personal history of nicotine dependence: Secondary | ICD-10-CM | POA: Insufficient documentation

## 2016-02-13 LAB — URINALYSIS, ROUTINE W REFLEX MICROSCOPIC
Bilirubin Urine: NEGATIVE
Glucose, UA: NEGATIVE mg/dL
HGB URINE DIPSTICK: NEGATIVE
Ketones, ur: NEGATIVE mg/dL
NITRITE: NEGATIVE
Protein, ur: NEGATIVE mg/dL
SPECIFIC GRAVITY, URINE: 1.017 (ref 1.005–1.030)
pH: 6 (ref 5.0–8.0)

## 2016-02-13 LAB — POC URINE PREG, ED: Preg Test, Ur: NEGATIVE

## 2016-02-13 LAB — CBC
HEMATOCRIT: 38.8 % (ref 36.0–46.0)
Hemoglobin: 13 g/dL (ref 12.0–15.0)
MCH: 28.7 pg (ref 26.0–34.0)
MCHC: 33.5 g/dL (ref 30.0–36.0)
MCV: 85.7 fL (ref 78.0–100.0)
PLATELETS: 263 10*3/uL (ref 150–400)
RBC: 4.53 MIL/uL (ref 3.87–5.11)
RDW: 14.5 % (ref 11.5–15.5)
WBC: 10.3 10*3/uL (ref 4.0–10.5)

## 2016-02-13 LAB — COMPREHENSIVE METABOLIC PANEL
ALT: 26 U/L (ref 14–54)
AST: 20 U/L (ref 15–41)
Albumin: 4.1 g/dL (ref 3.5–5.0)
Alkaline Phosphatase: 74 U/L (ref 38–126)
Anion gap: 9 (ref 5–15)
BILIRUBIN TOTAL: 0.5 mg/dL (ref 0.3–1.2)
BUN: 12 mg/dL (ref 6–20)
CO2: 24 mmol/L (ref 22–32)
Calcium: 9.4 mg/dL (ref 8.9–10.3)
Chloride: 106 mmol/L (ref 101–111)
Creatinine, Ser: 0.93 mg/dL (ref 0.44–1.00)
Glucose, Bld: 90 mg/dL (ref 65–99)
POTASSIUM: 4 mmol/L (ref 3.5–5.1)
Sodium: 139 mmol/L (ref 135–145)
TOTAL PROTEIN: 7.5 g/dL (ref 6.5–8.1)

## 2016-02-13 LAB — LIPASE, BLOOD: Lipase: 30 U/L (ref 11–51)

## 2016-02-13 NOTE — ED Triage Notes (Signed)
Patient is experiencing left lower back pain that radiates up on left back and around to left upper quad. Pain has occurred intermittently for about 2 months. Also, reports urinating reliefs some of the pressure. Denies nausea, vomiting, or fever.

## 2016-02-14 NOTE — ED Notes (Signed)
No answer when called for room assignment  

## 2016-02-14 NOTE — ED Notes (Signed)
Pt called x1

## 2016-02-27 DIAGNOSIS — E282 Polycystic ovarian syndrome: Secondary | ICD-10-CM

## 2016-02-27 HISTORY — DX: Polycystic ovarian syndrome: E28.2

## 2016-11-27 ENCOUNTER — Ambulatory Visit (INDEPENDENT_AMBULATORY_CARE_PROVIDER_SITE_OTHER): Payer: BLUE CROSS/BLUE SHIELD | Admitting: *Deleted

## 2016-11-27 DIAGNOSIS — Z111 Encounter for screening for respiratory tuberculosis: Secondary | ICD-10-CM | POA: Diagnosis not present

## 2016-11-29 ENCOUNTER — Encounter: Payer: Self-pay | Admitting: *Deleted

## 2016-11-29 ENCOUNTER — Ambulatory Visit (INDEPENDENT_AMBULATORY_CARE_PROVIDER_SITE_OTHER): Payer: BLUE CROSS/BLUE SHIELD | Admitting: *Deleted

## 2016-11-29 DIAGNOSIS — Z111 Encounter for screening for respiratory tuberculosis: Secondary | ICD-10-CM

## 2016-11-29 LAB — TB SKIN TEST
Induration: 0 mm
TB SKIN TEST: NEGATIVE

## 2016-12-06 ENCOUNTER — Ambulatory Visit: Payer: Self-pay | Admitting: Family Medicine

## 2017-10-23 ENCOUNTER — Ambulatory Visit (INDEPENDENT_AMBULATORY_CARE_PROVIDER_SITE_OTHER): Payer: BLUE CROSS/BLUE SHIELD

## 2017-10-23 DIAGNOSIS — Z111 Encounter for screening for respiratory tuberculosis: Secondary | ICD-10-CM | POA: Diagnosis not present

## 2017-10-23 NOTE — Progress Notes (Signed)
Tuberculin skin test applied to left ventral forearm.  Patient informed to schedule appt for nurse visit in 48-72 hours to have site read.  Wallace Cullens, RN

## 2017-10-25 ENCOUNTER — Ambulatory Visit (INDEPENDENT_AMBULATORY_CARE_PROVIDER_SITE_OTHER): Payer: BLUE CROSS/BLUE SHIELD

## 2017-10-25 DIAGNOSIS — Z111 Encounter for screening for respiratory tuberculosis: Secondary | ICD-10-CM

## 2017-10-25 LAB — TB SKIN TEST
Induration: 0 mm
TB Skin Test: NEGATIVE

## 2017-10-25 NOTE — Progress Notes (Signed)
   Patient here today to have PPD site read.   PPD read and results entered in Epic. Result: 0 mm induration. Interpretation: Negative Letter given for employer.  Agnes Probert, RN ([facility] RN) 

## 2018-09-27 ENCOUNTER — Encounter (INDEPENDENT_AMBULATORY_CARE_PROVIDER_SITE_OTHER): Payer: Self-pay

## 2018-09-27 ENCOUNTER — Telehealth: Payer: BLUE CROSS/BLUE SHIELD | Admitting: Physician Assistant

## 2018-09-27 DIAGNOSIS — Z20822 Contact with and (suspected) exposure to covid-19: Secondary | ICD-10-CM

## 2018-09-27 NOTE — Progress Notes (Signed)
I have spent 5 minutes in review of e-visit questionnaire, review and updating patient chart, medical decision making and response to patient.   Yaret Hush Cody Khalon Cansler, PA-C    

## 2018-09-27 NOTE — Progress Notes (Signed)
E-Visit for Corona Virus Screening   Your current symptoms could be consistent with the coronavirus.  Many health care providers can now test patients at their office but not all are.  [facility] has multiple testing sites. For information on our COVID testing locations and hours go to [email or web address]  Please quarantine yourself while awaiting your test results.  We are enrolling you in our [facility] for [place] . Daily you will receive a questionnaire within the [facility] [place] website. Our COVID 19 response team willl be monitoriing your responses daily.   COVID-19 is a respiratory illness with symptoms that are similar to the flu. Symptoms are typically mild to moderate, but there have been cases of severe illness and death due to the virus. The following symptoms may appear 2-14 days after exposure: . Fever . Cough . Shortness of breath or difficulty breathing . Chills . Repeated shaking with chills . Muscle pain . Headache . Sore throat . New loss of taste or smell . Fatigue . Congestion or runny nose . Nausea or vomiting . Diarrhea  It is vitally important that if you feel that you have an infection such as this virus or any other virus that you stay home and away from places where you may spread it to others.  You should self-quarantine for 14 days if you have symptoms that could potentially be coronavirus or have been in close contact a with a person diagnosed with COVID-19 within the last 2 weeks. You should avoid contact with people age 62 and older.   You should wear a mask or cloth face covering over your nose and mouth if you must be around other people or animals, including pets (even at home). Try to stay at least 6 feet away from other people. This will protect the people around you.  You may also take acetaminophen (Tylenol) as needed for fever.   Reduce your risk of any infection by using the same precautions used for avoiding the  common cold or flu:  Marland Kitchen Wash your hands often with soap and warm water for at least 20 seconds.  If soap and water are not readily available, use an alcohol-based hand sanitizer with at least 60% alcohol.  . If coughing or sneezing, cover your mouth and nose by coughing or sneezing into the elbow areas of your shirt or coat, into a tissue or into your sleeve (not your hands). . Avoid shaking hands with others and consider head nods or verbal greetings only. . Avoid touching your eyes, nose, or mouth with unwashed hands.  . Avoid close contact with people who are sick. . Avoid places or events with large numbers of people in one location, like concerts or sporting events. . Carefully consider travel plans you have or are making. . If you are planning any travel outside or inside the [place], visit the CDC's Travelers' Health webpage for the latest health notices. . If you have some symptoms but not all symptoms, continue to monitor at home and seek medical attention if your symptoms worsen. . If you are having a medical emergency, call 911.  HOME CARE . Only take medications as instructed by your medical team. . Drink plenty of fluids and get plenty of rest. . A steam or ultrasonic humidifier can help if you have congestion.   GET HELP RIGHT AWAY IF YOU HAVE EMERGENCY WARNING SIGNS** FOR COVID-19. If you or someone is showing any of these signs seek emergency medical care immediately. Call 911  or proceed to your closest emergency facility if: . You develop worsening high fever. . Trouble breathing . Bluish lips or face . Persistent pain or pressure in the chest . New confusion . Inability to wake or stay awake . You cough up blood. . Your symptoms become more severe  **This list is not all possible symptoms. Contact your medical provider for any symptoms that are sever or concerning to you.   MAKE SURE YOU   Understand these instructions.  Will watch your condition.  Will get help right  away if you are not doing well or get worse.  Your e-visit answers were reviewed by a board certified advanced clinical practitioner to complete your personal care plan.  Depending on the condition, your plan could have included both over the counter or prescription medications.  If there is a problem please reply once you have received a response from your provider.  Your safety is important to [place].  If you have drug allergies check your prescription carefully.    You can use MyChart to ask questions about today's visit, request a non-urgent call back, or ask for a work or school excuse for 24 hours related to this e-Visit. If it has been greater than 24 hours you will need to follow up with your provider, or enter a new e-Visit to address those concerns. You will get an e-mail in the next two days asking about your experience.  I hope that your e-visit has been valuable and will speed your recovery. Thank you for using e-visits.

## 2018-09-28 ENCOUNTER — Encounter (INDEPENDENT_AMBULATORY_CARE_PROVIDER_SITE_OTHER): Payer: Self-pay

## 2018-09-30 ENCOUNTER — Encounter (INDEPENDENT_AMBULATORY_CARE_PROVIDER_SITE_OTHER): Payer: Self-pay

## 2018-10-01 ENCOUNTER — Encounter (INDEPENDENT_AMBULATORY_CARE_PROVIDER_SITE_OTHER): Payer: Self-pay

## 2018-10-02 ENCOUNTER — Encounter (INDEPENDENT_AMBULATORY_CARE_PROVIDER_SITE_OTHER): Payer: Self-pay

## 2018-10-03 ENCOUNTER — Encounter (INDEPENDENT_AMBULATORY_CARE_PROVIDER_SITE_OTHER): Payer: Self-pay

## 2020-02-25 ENCOUNTER — Ambulatory Visit: Payer: BLUE CROSS/BLUE SHIELD

## 2020-03-02 ENCOUNTER — Other Ambulatory Visit: Payer: Self-pay

## 2020-03-02 ENCOUNTER — Ambulatory Visit (INDEPENDENT_AMBULATORY_CARE_PROVIDER_SITE_OTHER): Payer: 59

## 2020-03-02 DIAGNOSIS — Z111 Encounter for screening for respiratory tuberculosis: Secondary | ICD-10-CM | POA: Diagnosis not present

## 2020-03-04 ENCOUNTER — Ambulatory Visit: Payer: Medicaid Other

## 2020-03-04 ENCOUNTER — Other Ambulatory Visit: Payer: Self-pay

## 2020-03-04 DIAGNOSIS — Z111 Encounter for screening for respiratory tuberculosis: Secondary | ICD-10-CM

## 2020-03-04 LAB — TB SKIN TEST
Induration: 0 mm
TB Skin Test: NEGATIVE

## 2020-03-04 NOTE — Progress Notes (Signed)
PPD Reading Note  PPD read and results entered in EpicCare.  Result: 0 mm induration.  Interpretation: Negative  Allergic reaction: no

## 2020-03-04 NOTE — Progress Notes (Addendum)
Patient is here for a PPD placement.  It was placed on 03/02/2020 in the left forearm @ 0945 am.    Scheduled patient return visit on 03/04/20 for PPD read.    Talbot Grumbling, RN

## 2020-05-09 ENCOUNTER — Other Ambulatory Visit: Payer: Self-pay

## 2020-05-09 ENCOUNTER — Encounter: Payer: Self-pay | Admitting: Obstetrics and Gynecology

## 2020-05-09 ENCOUNTER — Ambulatory Visit (INDEPENDENT_AMBULATORY_CARE_PROVIDER_SITE_OTHER): Payer: 59 | Admitting: Obstetrics and Gynecology

## 2020-05-09 ENCOUNTER — Other Ambulatory Visit (HOSPITAL_COMMUNITY)
Admission: RE | Admit: 2020-05-09 | Discharge: 2020-05-09 | Disposition: A | Discharge status: Home / Self Care | Payer: 59 | Source: Ambulatory Visit | Attending: Obstetrics and Gynecology | Admitting: Obstetrics and Gynecology

## 2020-05-09 VITALS — BP 159/119 | HR 99 | Ht 66.0 in | Wt 356.0 lb

## 2020-05-09 DIAGNOSIS — Z01419 Encounter for gynecological examination (general) (routine) without abnormal findings: Secondary | ICD-10-CM | POA: Insufficient documentation

## 2020-05-09 DIAGNOSIS — Z0000 Encounter for general adult medical examination without abnormal findings: Secondary | ICD-10-CM

## 2020-05-09 DIAGNOSIS — Z6841 Body Mass Index (BMI) 40.0 and over, adult: Secondary | ICD-10-CM

## 2020-05-09 MED ORDER — ENALAPRIL MALEATE 10 MG PO TABS: 10.0000 mg | ORAL_TABLET | Freq: Every day | ORAL | 1 refills | Status: DC

## 2020-05-09 NOTE — Progress Notes (Signed)
Subjective:     Roberta Rodriguez is a 30 y.o. female P0 with LMP 03/23/20 and BMI 57 who is here for a comprehensive physical exam. The patient reports no problems. She is sexually active in a same-sex relationship. She desires STI testing. She denies pelvic pain or abnormal discharge. She reports long standing history of irregular cycle and previous diagnosis of PCOS. Patient is not on any contraception for cycle control. Patient is without any other complaints  Past Medical History:  Diagnosis Date  . Asthma   . Depression   . Frequent headaches   . PCOS (polycystic ovarian syndrome) 2018   Past Surgical History:  Procedure Laterality Date  . ADENOIDECTOMY    . TONSILLECTOMY AND ADENOIDECTOMY    . WISDOM TOOTH EXTRACTION     Family History  Problem Relation Age of Onset  . Asthma Other   . Depression Mother   . Hypertension Mother   . Other Other        Multiple Cancers in Grandparents & Aunts/uncles (Brain, Lung, Cervix)  . Other Other        Heart Disease in Grandparents & Aunts/Uncles; Negative in 1st degree relatives  . Diabetes Other        in Grandparents & Aunts/Uncles - no 1st degree  . Hypertension Other        in Grandparents & Aunts/Uncles  . Stroke Other        Aunts/Uncles; no first degree  . Thyroid disease Other        Aunts/Uncles; no first degree  . Hypertension Father     Social History   Socioeconomic History  . Marital status: Soil scientist    Spouse name: Not on file  . Number of children: Not on file  . Years of education: Not on file  . Highest education level: Not on file  Occupational History  . Not on file  Tobacco Use  . Smoking status: Former Research scientist (life sciences)  . Smokeless tobacco: Never Used  Vaping Use  . Vaping Use: Never used  Substance and Sexual Activity  . Alcohol use: Yes    Comment: "Once every few months"   . Drug use: No  . Sexual activity: Yes    Partners: Female    Birth control/protection: None  Other Topics Concern   . 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 Hepatitis C Screening  Never done  . COVID-19 Vaccine (1) Never done  . PAP-Cervical Cytology Screening  09/01/2018  . PAP SMEAR-Modifier  09/01/2018  . TETANUS/TDAP  07/02/2023  . INFLUENZA VACCINE  Completed  . HIV Screening  Completed  . HPV VACCINES  Aged Out       Review of Systems Pertinent items noted in HPI and remainder of comprehensive ROS otherwise negative.   Objective:  Blood pressure (!) 159/119, pulse 99, height 5\' 6"  (1.676 m), weight (!) 356 lb (161.5 kg), last menstrual period 03/23/2020.     GENERAL: Well-developed, well-nourished female in no acute distress.  HEENT: Normocephalic, atraumatic. Sclerae anicteric.  NECK: Supple. Normal thyroid.  LUNGS: Clear to auscultation bilaterally.  HEART: Regular rate and rhythm. BREASTS: Symmetric in size. No palpable masses or lymphadenopathy, skin changes, or nipple drainage. ABDOMEN: Soft, nontender, nondistended. No organomegaly. PELVIC: Normal external female genitalia. Vagina is pink and rugated.  Normal discharge. Normal appearing cervix. Bimanual exam limited secondary to body habitus EXTREMITIES: No cyanosis, clubbing, or edema, 2+ distal pulses.    Assessment:    Healthy female exam.      Plan:    Pap smear collected STI screening per patient request Patient will be contacted with abnormal results Patient to follow up with PCP for HTN management. Rx enalapril provided until appointment Discussed weight loss management to aid with PCOS- patient also referred to nutritionist Discussed benefits of  contraception for cycle control- patient is undecided at this time See After Visit Summary for Counseling Recommendations

## 2020-05-09 NOTE — Progress Notes (Signed)
Patient presents as New Patient AEX. Patient states that she would like STD testing. Patient has no vaginal discharge, odor, or irritation.  Last pap has been several years ago. Patient states that it was normal.

## 2020-05-09 NOTE — Patient Instructions (Signed)
Polycystic Ovary Syndrome  Polycystic ovarian syndrome (PCOS) is a common hormonal disorder among women of reproductive age. In most women with PCOS, small fluid-filled sacs (cysts) grow on the ovaries. PCOS can cause problems with menstrual periods and make it hard to get and stay pregnant. If this condition is not treated, it can lead to serious health problems, such as diabetes and heart disease. What are the causes? The cause of this condition is not known. It may be due to certain factors, such as:  Irregular menstrual cycle.  High levels of certain hormones.  Problems with the hormone that helps to control blood sugar (insulin).  Certain genes. What increases the risk? You are more likely to develop this condition if you:  Have a family history of PCOS or type 2 diabetes.  Are overweight, eat unhealthy foods, and are not active. These factors may cause problems with blood sugar control, which can contribute to PCOS or PCOS symptoms. What are the signs or symptoms? Symptoms of this condition include:  Ovarian cysts and sometimes pelvic pain.  Menstrual periods that are not regular or are too heavy.  Inability to get or stay pregnant.  Increased growth of hair on the face, chest, stomach, back, thumbs, thighs, or toes.  Acne or oily skin. Acne may develop during adulthood, and it may not get better with treatment.  Weight gain or obesity.  Patches of thickened and dark brown or black skin on the neck, arms, breasts, or thighs. How is this diagnosed? This condition is diagnosed based on:  Your medical history.  A physical exam that includes a pelvic exam. Your health care provider may look for areas of increased hair growth on your skin.  Tests, such as: ? An ultrasound to check the ovaries for cysts and to view the lining of the uterus. ? Blood tests to check levels of sugar (glucose), female hormone (testosterone), and female hormones (estrogen and progesterone). How  is this treated? There is no cure for this condition, but treatment can help to manage symptoms and prevent more health problems from developing. Treatment varies depending on your symptoms and if you want to have a baby or if you need birth control. Treatment may include:  Making nutrition and lifestyle changes.  Taking the progesterone hormone to start a menstrual period.  Taking birth control pills to help you have regular menstrual periods.  Taking medicines such as: ? Medicines to make you ovulate, if you want to get pregnant. ? Medicine to reduce extra hair growth.  Having surgery in severe cases. This may involve making small holes in one or both of your ovaries. This decreases the amount of testosterone that your body makes. Follow these instructions at home:  Take over-the-counter and prescription medicines only as told by your health care provider.  Follow a healthy meal plan that includes lean proteins, complex carbohydrates, fresh fruits and vegetables, low-fat dairy products, healthy fats, and fiber.  If you are overweight, lose weight as told by your health care provider. Your health care provider can determine how much weight loss is best for you and can help you lose weight safely.  Keep all follow-up visits. This is important. Contact a health care provider if:  Your symptoms do not get better with medicine.  Your symptoms get worse or you develop new symptoms. Summary  Polycystic ovarian syndrome (PCOS) is a common hormonal disorder among women of reproductive age.  PCOS can cause problems with menstrual periods and make it hard  to get and stay pregnant.  If this condition is not treated, it can lead to serious health problems, such as diabetes and heart disease.  There is no cure for this condition, but treatment can help to manage symptoms and prevent more health problems from developing. This information is not intended to replace advice given to you by your  health care provider. Make sure you discuss any questions you have with your health care provider. Document Revised: 07/23/2019 Document Reviewed: 07/23/2019 Elsevier Patient Education  [place].   Diet for Polycystic Ovary Syndrome Polycystic ovary syndrome (PCOS) is a common hormonal disorder that affects a woman's reproductive system. It can cause problems with menstrual periods and make it hard to get and stay pregnant. Changing what you eat can help your hormones reach normal levels, improve your health, and help you better manage PCOS. Following a balanced diet can help you lose weight and improve the way that your body uses the hormone insulin to control blood sugar. This may include:  Eating low-fat (lean) proteins, complex carbohydrates, fresh fruits and vegetables, low-fat dairy products, healthy fats, and fiber.  Cutting down on calories.  Exercising regularly. What are tips for following this plan?  Follow a balanced diet for meals and snacks. Eat breakfast, lunch, dinner, and one or two snacks every day.  Include protein in each meal and snack.  Choose whole grains instead of products that are made with refined flour.  Eat a variety of foods.  Exercise regularly as told by your health care provider. Aim to do at least 30 minutes of exercise on most days of the week.  If you are overweight or obese: ? Pay attention to how many calories you eat. Cutting down on calories can help you lose weight. ? Work with your health care provider or a dietitian to figure out how many calories you need each day. What foods should I eat? Fruits Include a variety of colors and types. All fruits are helpful for PCOS. Vegetables Include a variety of colors and types. All vegetables are helpful for PCOS. Grains Whole grains, such as whole wheat. Whole-grain breads, crackers, cereals, and pasta. Unsweetened oatmeal. Bulgur, barley, quinoa, and brown rice. Tortillas made from corn  or whole-wheat flour. Meats and other proteins Lean proteins, such as fish, chicken, beans, eggs, and tofu. Dairy Low-fat dairy products, such as skim milk, cheese sticks, and yogurt. Beverages Low-fat or fat-free drinks, such as water, low-fat milk, sugar-free drinks, and small amounts of 100% fruit juice. Seasonings and condiments Ketchup. Mustard. Barbecue sauce. Relish. Low-fat or fat-free mayonnaise. Fats and oils Olive oil or canola oil. Walnuts and almonds. The items listed above may not be a complete list of recommended foods and beverages. Contact a dietitian for more options.   What foods should I avoid? Foods that are high in calories or fat, especially saturated or trans fats. Fried foods. Sweets. Products that are made from refined white flour, including white bread, pastries, white rice, and pasta. The items listed above may not be a complete list of foods and beverages to avoid. Contact a dietitian for more information. Summary  PCOS is a hormonal imbalance that affects a woman's reproductive system. It can cause problems with menstrual periods and make it hard to get and stay pregnant.  You can help to manage your PCOS by exercising regularly and eating a healthy, varied diet of vegetables, fruit, whole grains, lean protein, and low-fat dairy products.  Changing what you eat can  improve the way that your body uses insulin, help your hormones reach normal levels, and help you lose weight. This information is not intended to replace advice given to you by your health care provider. Make sure you discuss any questions you have with your health care provider. Document Revised: 07/23/2019 Document Reviewed: 07/23/2019 Elsevier Patient Education  2021 [employer].

## 2020-05-10 LAB — HEMOGLOBIN A1C
Est. average glucose Bld gHb Est-mCnc: 123 mg/dL
Hgb A1c MFr Bld: 5.9 % — ABNORMAL HIGH (ref 4.8–5.6)

## 2020-05-10 LAB — COMPREHENSIVE METABOLIC PANEL
ALT: 18 IU/L (ref 0–32)
AST: 16 IU/L (ref 0–40)
Albumin/Globulin Ratio: 1.5 (ref 1.2–2.2)
Albumin: 4.4 g/dL (ref 3.9–5.0)
Alkaline Phosphatase: 90 IU/L (ref 44–121)
BUN/Creatinine Ratio: 10 (ref 9–23)
BUN: 8 mg/dL (ref 6–20)
Bilirubin Total: <0.2 mg/dL (ref 0.0–1.2)
CO2: 22 mmol/L (ref 20–29)
Calcium: 9.4 mg/dL (ref 8.7–10.2)
Chloride: 102 mmol/L (ref 96–106)
Creatinine, Ser: 0.81 mg/dL (ref 0.57–1.00)
Globulin, Total: 3 g/dL (ref 1.5–4.5)
Glucose: 97 mg/dL (ref 65–99)
Potassium: 4.2 mmol/L (ref 3.5–5.2)
Sodium: 139 mmol/L (ref 134–144)
Total Protein: 7.4 g/dL (ref 6.0–8.5)
eGFR: 101 mL/min/{1.73_m2} (ref >59)

## 2020-05-10 LAB — CBC
Hematocrit: 42.4 % (ref 34.0–46.6)
Hemoglobin: 13.5 g/dL (ref 11.1–15.9)
MCH: 27.7 pg (ref 26.6–33.0)
MCHC: 31.8 g/dL (ref 31.5–35.7)
MCV: 87 fL (ref 79–97)
Platelets: 302 10*3/uL (ref 150–450)
RBC: 4.88 x10E6/uL (ref 3.77–5.28)
RDW: 14.3 % (ref 11.7–15.4)
WBC: 8.1 10*3/uL (ref 3.4–10.8)

## 2020-05-10 LAB — CERVICOVAGINAL ANCILLARY ONLY
Chlamydia: NEGATIVE
Comment: NEGATIVE
Comment: NEGATIVE
Comment: NORMAL
Neisseria Gonorrhea: NEGATIVE
Trichomonas: NEGATIVE

## 2020-05-10 LAB — TSH: TSH: 2.67 u[IU]/mL (ref 0.450–4.500)

## 2020-05-10 LAB — HIV ANTIBODY (ROUTINE TESTING W REFLEX): HIV Screen 4th Generation wRfx: NONREACTIVE

## 2020-05-10 LAB — CYTOLOGY - PAP: Diagnosis: NEGATIVE

## 2020-05-10 LAB — LIPID PANEL
Chol/HDL Ratio: 4.4 ratio (ref 0.0–4.4)
Cholesterol, Total: 225 mg/dL — ABNORMAL HIGH (ref 100–199)
HDL: 51 mg/dL (ref >39)
LDL Chol Calc (NIH): 152 mg/dL — ABNORMAL HIGH (ref 0–99)
Triglycerides: 123 mg/dL (ref 0–149)
VLDL Cholesterol Cal: 22 mg/dL (ref 5–40)

## 2020-05-10 LAB — RPR: RPR Ser Ql: NONREACTIVE

## 2020-05-10 LAB — HEPATITIS B SURFACE ANTIGEN: Hepatitis B Surface Ag: NEGATIVE

## 2020-05-10 LAB — HEPATITIS C ANTIBODY: Hep C Virus Ab: <0.1 s/co ratio (ref 0.0–0.9)

## 2020-05-27 ENCOUNTER — Other Ambulatory Visit: Payer: Self-pay

## 2020-05-27 ENCOUNTER — Ambulatory Visit (INDEPENDENT_AMBULATORY_CARE_PROVIDER_SITE_OTHER): Payer: 59 | Admitting: Family Medicine

## 2020-05-27 ENCOUNTER — Encounter: Payer: Self-pay | Admitting: Family Medicine

## 2020-05-27 VITALS — BP 152/82 | HR 83 | Wt 361.6 lb

## 2020-05-27 DIAGNOSIS — L732 Hidradenitis suppurativa: Secondary | ICD-10-CM | POA: Diagnosis not present

## 2020-05-27 DIAGNOSIS — I10 Essential (primary) hypertension: Secondary | ICD-10-CM | POA: Diagnosis not present

## 2020-05-27 DIAGNOSIS — F39 Unspecified mood [affective] disorder: Secondary | ICD-10-CM | POA: Diagnosis not present

## 2020-05-27 DIAGNOSIS — R030 Elevated blood-pressure reading, without diagnosis of hypertension: Secondary | ICD-10-CM | POA: Insufficient documentation

## 2020-05-27 NOTE — Patient Instructions (Addendum)
It was wonderful to see you today.  Please bring ALL of your medications with you to every visit.   Today we talked about:   --Going to Dermatology   -- Going to Psychiatry for your mood   --Looking at therapists at [email or web address]   --Follow up in 2 weeks    Thank you for choosing [facility].   Please call [phone] with any questions about today's appointment.  Please be sure to schedule follow up at the front  desk before you leave today.   Roberta Singh, MD  Family Medicine        Therapy and Counseling Resources Most providers on this list will take Medicaid. Patients with commercial insurance or Medicare should contact their insurance company to get a list of in network providers.  [facility]:Psychiatry and Counseling [address] [facility] [place]. [place], [place] [address] [place]  [address], [place], [place] [address]      [place] [address]  [place], [place] [address] [phone]  [place] [address] [place]  [place], [place] [address]       [phone]      Jinny Blossom [facility] [address] [place], [place]  Family Solutions:  [place]. [place] [phone]  [facility]:  [place] [address] Rosie Fate [phone]  [facility] (under & uninsured) [address], [place] [phone]    [email or web address]    [place] [address] . [place]    [phone]  [facility]     Phone:  [phone]     [place]  [place] [address]      [place], [place] ext [place]: [place], [place], [place]   [place]  ([place] therapist) [email or web address]  [place] [address]   [place] [address]    [phone]    [facility]   [address],  [place], [place]   [address] [place]  [place]  [facility]  [address] [place], [place]        [phone]  Open Access/Walk In Clinic under & uninsured  [facility]  [address] [place], [place] Crisis [phone]  [facility] the [place],  ([place])   [place], [place]: [phone]) 8:30 - 12; 1 - 2:30  Family Service of the [place],  [place], [place]    [phone]):8:30 - 12; 2 - 3PM  [facility] [employer],  [address]  [place]; [phone]):   Mon - Fri 8 AM - 5 PM  [facility] [place]  MWF 12:30 to 3:00 or call to schedule an appointment  [phone]  Specific Provider options Psychology Today  [email or web address] 1. click on find a therapist  2. enter your zip code 3. left side and select or tailor a therapist for your specific need.   [facility] Provider Directory [email or web address]  (Medicaid)   Follow all drop down to find a provider  Social Support program  [place]) [phone] or [email or web address] [address], [place], [place] Recovery support and educational   24- Hour Availability:  .  [place] [facility]  . [place], [place] Crisis [phone]  . Family Service of the [employer] [phone]  [facility] Crisis Service  [phone]   . [place]  [phone] (after hours)  . Therapeutic Alternative/Mobile Crisis   [phone]  . [place] National Suicide Hotline  269-796-5324 ([place])  . Call 911 or  go to emergency room  . [employer]  [phone]);  [place] and [employer]   . [facility] ACCESS  [phone]); [place], [place], [place], [place], [place], [place], [place]    If you are feeling suicidal or depression symptoms worsen please immediately go to:   [place]  [address] [place], [place] Crisis [phone]    . If you are thinking about harming yourself or having thoughts of suicide, or if you know someone who is, seek help right away. . Call your doctor or mental health care provider. . Call 911 or go to a hospital emergency room to get immediate help, or ask a friend or family member to help you do these things. . Call the [place] National Suicide Prevention Lifeline's toll-free, 24-hour hotline at 1-800-273-TALK 6092031985) or TTY: 1-800-799-4 TTY (954)176-0754) to talk to a trained counselor. . If you are in crisis, make sure you are not left alone.  . If someone else is in crisis, make sure he or she is not left alone   Family Service of the [employer] (Domestic Violence, Rape & Victim Assistance [place]    (ONLY from 8am-4pm)    [phone]  Therapeutic Alternative Mobile Crisis Unit (24/7)   [phone]  [place] National Suicide Hotline   2200840258 (TALK)    Blood Pressure Activity Diary Time Lying down/ Sleeping Walking/ Exercise Stressed/ Angry Headache/ Pain Dizzy  9 AM       10 AM       11 AM       12 PM       1 PM       2 PM       Time Lying down/ Sleeping Walking/ Exercise Stressed/ Angry Headache/ Pain Dizzy  3 PM       4 PM        5 PM       6 PM       7 PM       8 PM       Time Lying down/ Sleeping Walking/ Exercise Stressed/ Angry Headache/ Pain Dizzy  9 PM       10 PM       11 PM       12 AM       1 AM       2 AM       3 AM       Time Lying down/ Sleeping Walking/ Exercise  Stressed/ Angry Headache/ Pain Dizzy  4 AM       5 AM       6 AM       7 AM       8 AM       9 AM       10 AM        Time you woke up: _________  Time you went to sleep:__________   Come back Monday Morning to have the monitor removed  Call the [facility] if you have any questions before then ([phone])  Wearing the Blood Pressure Monitor  The cuff will inflate every 20 minutes during the day and every 30 minutes while you sleep.  Your blood pressure readings will NOT display after cuff inflation  Fill out the blood pressure-activity diary during the day, especially during activities that may affect your reading -- such as exercise, stress, walking, taking your blood pressure medications  Important things to know:  Avoid taking the monitor off for the next 24 hours, unless it causes you discomfort or pain.  Do NOT get the monitor wet and do NOT dry to clean the monitor with any cleaning products.  Do NOT put the monitor on anyone else's arm.  When the cuff inflates, avoid excess movement. Let the cuffed arm hang loosely, slightly away from the body. Avoid flexing the muscles or moving the hand/fingers.  When you go to sleep, make sure that the hose is not kinked.  Remember to fill out the blood pressure activity diary.  If you experience severe pain or unusual pain (not associated with getting your blood pressure checked), remove the monitor.  Troubleshooting:  Code  Troubleshooting   1  Check cuff position, tighten cuff   2, 3  Remain still during reading   4, 87  Check air hose connections and make sure cuff is tight   85, 89  Check hose connections and make tubing is not crimped   86  Push START/STOP to restart reading   88, 91  Retry by pushing START/STOP   90  Replace batteries. If problem persists, remove monitor and bring back to   clinic at follow up   97, 98, 99  Service required - Remove monitor and bring back to clinic at  follow up

## 2020-05-27 NOTE — Progress Notes (Signed)
Asked by Dr. Owens Shark to set-up / provide Ambulatory 24 BP monitor.   Patient educated on proper use and blood pressure cuff placed.  Following instruction patient verbalized understanding of treatment plan.   Patient will return to [facility] on Monday AM.

## 2020-05-27 NOTE — Assessment & Plan Note (Signed)
Dicussed differential with patient, supportive listening. Referral to Psychiatry. List of therapists provided. Close follow up with PCP with next available. Suicide information provided.

## 2020-05-27 NOTE — Assessment & Plan Note (Signed)
Likely has essential hypertension. Given risk factors of family history and obesity, likely primary hypertension. No severe ranges. Discussed options. Will obtain 24 hour ambulatory cuff and proceed after that.  If values elevated, will start preferred agent (has not started ACEi, would prefer CCB or ARB).   Question if BP elevation and headache related or if due to other cause (OSA, less likely Idiopathic intracranial hypertension).

## 2020-05-27 NOTE — Progress Notes (Signed)
    SUBJECTIVE:   CHIEF COMPLAINT / HPI:   Roberta Rodriguez is a pleasant 30 year old with history of elevated blood pressure presenting today for check up and re-establish care.   She was last seen by a physician at [facility] > 6 years ago. Has been seeing Gynecology since that time. Blood pressure has been elevated for several years. She was recently prescribed enalapril, has not yet started. Has intermittent headaches, worse in AM. No vision changes, nausea, vomiting, chest pain. Has chronic dyspnea on exertion when 'overexerts'.   Patient reports her depression is much worse. 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 Has struggled with depression her whole life. Has had one prior attempt to hurt herself, none recent. Reports passive thoughts of not wanting to live. No plans to hurt herself. Her partner, [name], is aware of mood changes. She is interested in possibly therapy and medications. She has already reached out to some therapists. She has a family history of bipolar disorder. She has a history of excess spending and purchasing followed by guilt. Uses cannabis products but no alcohol use.   In terms of current medications, patient takes Clindamycin for hydradenitis. Has struggled with this for years. Grandmother has had complications from surgeries which she worries about.   She is not working currently. Partner, [name], lives with her. Has lots of family support. Partner supportive.   PERTINENT  PMH / PSH/Family/Social History :   Not taking albuterol--no documented PFT to suggest asthma Elevated BP-- several occasions and strong family history  Obesity Irregular menses Hydradenitis   MDQ positive in 2015   OBJECTIVE:   BP (!) 152/82   Pulse 83   Wt (!) 361 lb 9.6 oz (164 kg)   SpO2 100%   BMI 58.36 kg/m   Today's weight:  Last Weight  Most recent update: 05/27/2020  9:23 AM   Weight  164 kg (361 lb 9.6 oz)             Review of prior  weights: Filed Weights   05/27/20 0923  Weight: (!) 361 lb 9.6 oz (164 kg)   Repeat BP 140/73  Cardiac: Regular rate and rhythm. Normal S1/S2. No murmurs, rubs, or gallops appreciated. Lungs: Clear bilaterally to ascultation.  Psych: Tearful, flat affect    ASSESSMENT/PLAN:   Primary hypertension Likely has essential hypertension. Given risk factors of family history and obesity, likely primary hypertension. No severe ranges. Discussed options. Will obtain 24 hour ambulatory cuff and proceed after that.  If values elevated, will start preferred agent (has not started ACEi, would prefer CCB or ARB).   Question if BP elevation and headache related or if due to other cause (OSA, less likely Idiopathic intracranial hypertension).    Mood disorder Dicussed differential with patient, supportive listening. Referral to Psychiatry. List of therapists provided. Close follow up with PCP with next available. Suicide information provided.     HCM Up to date on vaccines per patient   At follow up discuss: headaches, weight change     Dorris Singh, MD  [place]

## 2020-05-30 ENCOUNTER — Ambulatory Visit: Payer: 59 | Admitting: Pharmacist

## 2020-05-30 ENCOUNTER — Telehealth: Payer: Self-pay | Admitting: Pharmacist

## 2020-05-30 NOTE — Telephone Encounter (Signed)
Patient unable to return Amb BP monitor (worn on 4/1-03/2020) today due to Stroke in her [name] (who is in [place]).   Following some discussion, we agreed that she can return monitor in the AM 4/5 and I will plan to call her with results, likely late 4/5 or on 4/6.

## 2020-06-01 ENCOUNTER — Ambulatory Visit: Payer: 59 | Admitting: Pharmacist

## 2020-06-02 ENCOUNTER — Telehealth: Payer: Self-pay | Admitting: Pharmacist

## 2020-06-02 NOTE — Telephone Encounter (Signed)
Noted. Attempted to call to follow up re: recent stressors.  Dorris Singh, MD  Family Medicine Teaching Service

## 2020-06-02 NOTE — Telephone Encounter (Signed)
Patient evaluated for elevated blood pressure readings in office.    Home ambulatory monitor placed 4/1 and returned to the office 4/5.  She reported that the monitor failed to work after about 5 hours (likely related to battery failure).     Daytime Mean BP:  119/67 mmHg HR: 82   A/P: Likely an element of white coat elevated blood pressures based on significantly lower blood pressure readings during evaluation.  Average blood pressure of 119/67 mmHg with 12 readings over ~ 5 hours.  - no medication recommended at this time.  - discussed potential repeat of this amb BP monitor in the future if in office readings are found to be elevated prior to initiation of any medication.   Results reviewed and verbalized understanding of information provided.

## 2020-06-13 ENCOUNTER — Other Ambulatory Visit: Payer: Self-pay

## 2020-06-13 ENCOUNTER — Encounter: Payer: Self-pay | Admitting: Registered"

## 2020-06-13 ENCOUNTER — Encounter: Payer: 59 | Attending: Obstetrics and Gynecology | Admitting: Registered"

## 2020-06-13 DIAGNOSIS — E282 Polycystic ovarian syndrome: Secondary | ICD-10-CM | POA: Insufficient documentation

## 2020-06-13 NOTE — Patient Instructions (Addendum)
Work on getting better sleep and increasing water intake earlier in the day  Consider starting your day with 2 large glasses of water and continue drinking during the day and less as you are getting closer to bedtime. Start bedtime routine between 7-8 pm for a goal of 9 pm bedtime. For restless legs and helping sleep try Magnesium supplement as you start your wind-down routine. When getting up during the night have minimal light on but enough to prevent tripping. Avoid looking at screens once you have started you bedtime routine. For wind-down activities do something relaxing such as arts and crafts, listening to audio books or reading.  Look into other supplements (inositol & zinc) that may help with PCOS,. Only start one supplement at a time.  Return for follow-up to start building more structured eating patterns

## 2020-06-13 NOTE — Progress Notes (Signed)
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 Pt states when she was told that she has PCOS it created a lot of stress for her because she tends to worry about things a lot. Pt was in tears talking about her fears.   Pt states she has irregular eating and sleeping patterns. Pt states her sleep is disturbed by restless legs and having to get up during the night. Pt reports snoring and being tested for sleep apnea. Pt states she did not have OSA, however the test revealed low oxygen levels. Pt reports being thirsty during the night. Pt states she may be in the bathroom for up to an hour during the night and takes her phone with her for something to do.  Pt states has joined some PCOS support groups which has helped relieve her stress.  Estimated daily fluid intake: 48 oz Supplements: none Sleep: 8-12 hrs; disturbed by needing to use the bathroom ~3x night Stress / self-care: 9/10; breathing exercises 3x week, wine or TV Current average weekly physical activity: ADLs  24-Hr Dietary Recall Dairy: Cheese, butter, ice cream, sour cream, cream cheese Almond milk on cereal First Meal 11:30 - 12:00: meatloaf, mashed potatoes, green beans, corn Snack:  Second Meal:  Snack: chips Third Meal: meatloaf, mashed potatoes, green beans, corn Snack: little bites,  brownies Beverages: water 32 oz, juice, soda  Estimated Energy Needs Calories: 2100   NUTRITION DIAGNOSIS  NI-5.8.3 Inappropriate intake of types of carbohydrates (specify): relying on chips and other non-nutritous foods for snacks As related to snacking rather than structured meals.  As evidenced by dietary recall.   NUTRITION INTERVENTION  Nutrition education (E-1) on the following topics:  . Role of sleep and rest in making better choices during the day  Handouts Provided Include   PCOS guide for parents  PCOS resources  Inositol and PCOS  Ovasitol brochure  Learning Style & Readiness for Change Teaching method utilized: Visual & Auditory  Demonstrated degree of understanding via: Teach Back  Barriers to learning/adherence to lifestyle change: none  Goals Established by Pt Work on getting better sleep and increasing water intake earlier in the day  Consider starting your day with 2 large glasses of water and continue drinking during the day and less as you are getting closer to bedtime. Start bedtime routine between 7-8 pm for a goal of 9 pm bedtime. For restless legs and helping sleep try Magnesium supplement as you start your wind-down routine. When getting up during the night have minimal light on but enough to prevent tripping. Avoid looking at screens once you have started you bedtime routine. For wind-down activities do something relaxing such as arts and crafts, listening to audio books or reading.  Look into other supplements (inositol & zinc) that may help with PCOS,. Only start one supplement at a time.  Return for follow-up to start building more structured eating patterns   MONITORING & EVALUATION Dietary  intake, weekly physical activity, and sleep in 4-6 weeks.  Next Steps  Patient is to work on goals to get more structured and better quality sleep. Return for follow-up visit to build on this foundation.

## 2020-06-17 ENCOUNTER — Encounter: Payer: Self-pay | Admitting: Family Medicine

## 2020-06-17 ENCOUNTER — Other Ambulatory Visit: Payer: Self-pay

## 2020-06-17 ENCOUNTER — Ambulatory Visit (INDEPENDENT_AMBULATORY_CARE_PROVIDER_SITE_OTHER): Payer: 59 | Admitting: Family Medicine

## 2020-06-17 VITALS — BP 143/92 | HR 80 | Ht 66.0 in | Wt 355.0 lb

## 2020-06-17 DIAGNOSIS — G4719 Other hypersomnia: Secondary | ICD-10-CM

## 2020-06-17 DIAGNOSIS — L0291 Cutaneous abscess, unspecified: Secondary | ICD-10-CM

## 2020-06-17 DIAGNOSIS — R030 Elevated blood-pressure reading, without diagnosis of hypertension: Secondary | ICD-10-CM

## 2020-06-17 MED ORDER — CLINDAMYCIN PHOSPHATE 1 % EX SOLN
Freq: Two times a day (BID) | CUTANEOUS | 0 refills | Status: DC
Start: 2020-06-17 — End: 2022-01-22

## 2020-06-17 NOTE — Patient Instructions (Addendum)
It was wonderful to see you today.  Please bring ALL of your medications with you to every visit.   Today we talked about:  --Think about the covid vaccine  You will be called about a sleep study   We will obtain asthma tests in the fall   Try  Replens cleanser for your vaginal symptoms  Thank you for choosing [facility].   Please call [phone] with any questions about today's appointment.  Please be sure to schedule follow up at the front  desk before you leave today.   Dorris Singh, MD  Family Medicine     If you are feeling suicidal or depression symptoms worsen please immediately go to:   [place]  [address] [place], [place] Crisis [phone]    . If you are thinking about harming yourself or having thoughts of suicide, or if you know someone who is, seek help right away. . Call your doctor or mental health care provider. . Call 911 or go to a hospital emergency room to get immediate help, or ask a friend or family member to help you do these things. . Call the [place] National Suicide Prevention Lifeline's toll-free, 24-hour hotline at 1-800-273-TALK (214) 523-9326) or TTY: 1-800-799-4 TTY 825-610-9430) to talk to a trained counselor. . If you are in crisis, make sure you are not left alone.  . If someone else is in crisis, make sure he or she is not left alone   Family Service of the [employer] (Domestic Violence, Rape & Victim Assistance [phone]  [facility] [place]    (ONLY from 8am-4pm)    [phone]  Therapeutic Alternative Mobile Crisis Unit (24/7)   [phone]  [place] National Suicide Hotline   (403)065-4309 [place]am[place]ina Monks)

## 2020-06-17 NOTE — Assessment & Plan Note (Signed)
Blood pressure is slightly elevated again today.  However on 24-hour monitoring out of office measurement her blood pressures are as low as 90/60 which makes me hesitant to start any therapy.  We discussed that we will continue to monitor closely she does have risk factors for developing true hypertension and of course will monitor closely.

## 2020-06-17 NOTE — Progress Notes (Signed)
SUBJECTIVE:   CHIEF COMPLAINT / HPI:   Roberta Rodriguez is a very pleasant 30 year old woman with history significant for elevated in office blood pressure readings with normal home monitoring presenting today for follow-up.  She has several items she wishes to discuss today.  She has morning headaches which have been going on for many years.  She reports when she wakes up in the morning after she gets up she has a glass of water and has a headache.  This lasts for several hours and usually goes away then.  She endorses excessive daytime sleepiness and does snore at night.  She denies nausea, vomiting, unilaterality, sensitivity to light or sound.  She sometimes has daytime headaches 2.  She is does not drink excess caffeine.  She does not drink alcohol.  She does smoke marijuana which she is trying to reduce.  She has previously had a sleep study many years ago which she thinks was normal.  She does describe what she feels is off balance.  She reports she has a vision deficit but does not wear glasses.  The patient reports her mood is improved.  She reports she spent a lot of the week talking to people.  She [facility] which she found very helpful and has another therapy appointment this coming week. Denies SI/HI or plan to hurt herself.  She does report that 1 thing she has notices that she has variable sexual arousal and libido.  She does report some vaginal dryness.  She denies discharge.  She reports regular menses  The patient has been seen by nutrition and making significant changes to her diet.  PERTINENT  PMH / PSH/Family/Social History : Updated and reviewed of note she has a previous history of asthma without pulmonary function tests  OBJECTIVE:   BP (!) 143/92   Pulse 80   Ht 5\' 6"  (1.676 m)   Wt (!) 355 lb (161 kg)   LMP 05/23/2020   SpO2 100%   BMI 57.30 kg/m   Today's weight:  Last Weight  Most recent update: 06/17/2020 10:16 AM   Weight   161 kg (355 lb)             Review of prior weights: [employer]   06/17/20 1015  Weight: (!) 355 lb (161 kg)     Cardiac: Regular rate and rhythm. Normal S1/S2. No murmurs, rubs, or gallops appreciated. Lungs: Clear bilaterally to ascultation.  Psych: Pleasant and appropriate  Cranial nerves II through XII were tested and intact normal upper extremity strength and normal gait.  Normal finger-to-nose negative Romberg and normal tandem gait  ASSESSMENT/PLAN:   Elevated blood pressure reading in office with white coat syndrome, without diagnosis of hypertension Blood pressure is slightly elevated again today.  However on 24-hour monitoring out of office measurement her blood pressures are as low as 90/60 which makes me hesitant to start any therapy.  We discussed that we will continue to monitor closely she does have risk factors for developing true hypertension and of course will monitor closely.   Cannabis use continue to discuss cessation  Daily headache differential includes side effect of cannabis versus obstructive sleep apnea, less likely idiopathic intracranial hypertension.  Will obtain split-night sleep study.  Low suspicion for migraine at this time.  No red flag symptoms and normal cranial nerve exam making less suspicious for intracranial pathology.   History of asthma, discussed my recommendation for COVID-vaccine and pulmonary function  test.  She is still protected on complete about the COVID-vaccine thus we cannot schedule pulmonary function test in our office.  We will consider referral to pulmonology for formal testing versus testing or office in the fall.  The patient is considering getting the COVID-vaccine in the next few months.  Hydradenitis- patient has an appointment in September with dermatology.  She reports she is using clindamycin orally without any relief.  We discussed the long-term possible side effects of this.  Also unclear if she has a true diagnosis of  hidradenitis suppurativa versus just recurrent abscesses.  As such we will transition to the topical formulation and she has had significant benefit with this in the past  Depressed Mood-question if this is the cause of her change in arousal versus hormonal cause.  We will start with Replens cleanser.  Examination at follow-up consider estrogen gel, measuring of TSH and more discussion of sexual dysfunction.  Healthcare maintenance The patient is up-to-date on her Pap   Dorris Singh, [place]

## 2020-07-22 ENCOUNTER — Other Ambulatory Visit: Payer: Self-pay

## 2020-07-22 ENCOUNTER — Ambulatory Visit (INDEPENDENT_AMBULATORY_CARE_PROVIDER_SITE_OTHER): Payer: 59 | Admitting: Family Medicine

## 2020-07-22 ENCOUNTER — Encounter: Payer: Self-pay | Admitting: Family Medicine

## 2020-07-22 VITALS — BP 158/92 | HR 77 | Wt 355.2 lb

## 2020-07-22 DIAGNOSIS — L819 Disorder of pigmentation, unspecified: Secondary | ICD-10-CM | POA: Diagnosis not present

## 2020-07-22 DIAGNOSIS — R030 Elevated blood-pressure reading, without diagnosis of hypertension: Secondary | ICD-10-CM

## 2020-07-22 DIAGNOSIS — J45909 Unspecified asthma, uncomplicated: Secondary | ICD-10-CM | POA: Diagnosis not present

## 2020-07-22 DIAGNOSIS — R1011 Right upper quadrant pain: Secondary | ICD-10-CM | POA: Diagnosis not present

## 2020-07-22 DIAGNOSIS — F39 Unspecified mood [affective] disorder: Secondary | ICD-10-CM

## 2020-07-22 MED ORDER — ALBUTEROL SULFATE HFA 108 (90 BASE) MCG/ACT IN AERS: 1.0000 | INHALATION_SPRAY | Freq: Four times a day (QID) | RESPIRATORY_TRACT | 2 refills | Status: DC | PRN

## 2020-07-22 NOTE — Assessment & Plan Note (Signed)
Differential includes cholelithiasis (symptomatic, some symptoms consistent with this), Celiac, or a disorder of gut-brain axis such as functional dyspepsia/IBS. Also considered Crohn's or UC but no family history, no melena, no hematochezia, no weight loss.  -RUQ ultrasound - Celiac panel - Had normal CBC and CMP earlier this year  - Consider additional evaluation pending progression of symptoms

## 2020-07-22 NOTE — Progress Notes (Signed)
    SUBJECTIVE:   CHIEF COMPLAINT / HPI:   Roberta Rodriguez is a pleasant 30 yo with history of asthma (no PFT on file) and mood disorder presenting with stomach concerns.  Abdominal Issues Patient reports that since 2014 she has had stomach issues. After consuming a meal she will have bloating, gas,intermittent RUQ pain, and often diarrhea. Has multiple loose stools per day. 1-2 times per month after eating later in the night she will have emesis. Associated nausea. No weight loss, melena, hematochezia, hematemesis, no fevers. No EtOH use. No family history of IBD. Has tried to change diet and notes that if she eats cheese she is constipated. Does eat gluten and meats.   Morning Headaches She has intermittent morning headaches. She does snore at night. Roberta Rodriguez sometimes reports she stops breathing. She has her sleep study scheduled.   Skin Concerns Patient reports HS is intermittently flaring. Clindamycin gel works to some degree, awaiting Dermatology for this. She has noticed a change in a pigmented lesion on medial aspect of L foot.  Desires skin tag removal.   PERTINENT  PMH / PSH/Family/Social History : Updated and reviewed   OBJECTIVE:   BP (!) 158/92   Pulse 77   Wt (!) 355 lb 3.2 oz (161.1 kg)   SpO2 99%   BMI 57.33 kg/m   Today's weight:  Last Weight  Most recent update: 07/22/2020 10:01 AM   Weight  161.1 kg (355 lb 3.2 oz)             Review of prior weight-noted    Cardiac: Regular rate and rhythm. Normal S1/S2. No murmurs, rubs, or gallops appreciated. Lungs: Clear bilaterally to ascultation.  Abdomen: Normoactive bowel sounds. No tenderness to deep or light palpation. No rebound or guarding  Minimal RUQ pain  Psych: Pleasant and appropriate  + acanthosis Several hyperpigmented acrochordons  On medial aspect L foot--hyperpigmented macule with irregular border  ASSESSMENT/PLAN:   Elevated blood pressure reading in office with white coat syndrome, without  diagnosis of hypertension Elevated on in office reading--normal 24 hour with appropriate sized cuff  Mood disorder Seeing [facility] and really doing well.  Has good support and follow up scheduled   RUQ pain Differential includes cholelithiasis (symptomatic, some symptoms consistent with this), Celiac, or a disorder of gut-brain axis such as functional dyspepsia/IBS. Also considered Crohn's or UC but no family history, no melena, no hematochezia, no weight loss.  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    History of Reactive Airway Disease/Asthma - refilled inhaler  - Referred for PFT to confirm diagnosis  Daily Headache- considered OSA, less likely idiopathic intracranial hypertension. If persists, recommend imaging, consideration of Neurolgoy   At follow up consider GLP1 for weight.   HCM Declined COVID vaccines- discussed     Roberta Singh, MD  [place]

## 2020-07-22 NOTE — Assessment & Plan Note (Signed)
Atypical nevus vs early acral melanoma--referral to Dermatology, discussed importance with patient.

## 2020-07-22 NOTE — Assessment & Plan Note (Signed)
Elevated on in office reading--normal 24 hour with appropriate sized cuff

## 2020-07-22 NOTE — Assessment & Plan Note (Signed)
Seeing [facility] and really doing well.  Has good support and follow up scheduled

## 2020-07-22 NOTE — Patient Instructions (Addendum)
It was wonderful to see you today.  Please bring ALL of your medications with you to every visit.   Today we talked about:  For HS - Use only Aveeno  - Let me know if you get a severe flare  For the spot on your foot -I referred you to Dermatology   For your asthma - We will get lung test through the lung doctor    For your stomach - We will get an ultrasound and blood testing    THINK ABOUT COVID VACCINE   We will call you with an appointment   Thank you for choosing [facility].   Please call [phone] with any questions about today's appointment.  Please be sure to schedule follow up at the front  desk before you leave today.   Dorris Singh, MD  Family Medicine

## 2020-07-25 LAB — CELIAC DISEASE AB SCREEN W/RFX
Antigliadin Abs, IgA: 5 units (ref 0–19)
IgA/Immunoglobulin A, Serum: 222 mg/dL (ref 87–352)
Transglutaminase IgA: <2 U/mL (ref 0–3)

## 2020-07-28 ENCOUNTER — Telehealth: Payer: Self-pay

## 2020-07-28 MED ORDER — ALBUTEROL SULFATE HFA 108 (90 BASE) MCG/ACT IN AERS: 1.0000 | INHALATION_SPRAY | Freq: Four times a day (QID) | RESPIRATORY_TRACT | 3 refills | Status: DC | PRN

## 2020-07-28 NOTE — Telephone Encounter (Signed)
Pt called requesting that Dr.Brown send in script for her inhaler, the pharmacy still doesn't have it.

## 2020-07-28 NOTE — Telephone Encounter (Signed)
New inhaler to pharmacy.  Dorris Singh, MD  Family Medicine Teaching Service

## 2020-07-29 ENCOUNTER — Encounter: Payer: 59 | Attending: Obstetrics and Gynecology | Admitting: Registered"

## 2020-07-29 ENCOUNTER — Other Ambulatory Visit: Payer: Self-pay

## 2020-07-29 ENCOUNTER — Encounter: Payer: Self-pay | Admitting: Registered"

## 2020-07-29 DIAGNOSIS — E282 Polycystic ovarian syndrome: Secondary | ICD-10-CM | POA: Insufficient documentation

## 2020-07-29 NOTE — Progress Notes (Signed)
Medical Nutrition Therapy  Appointment Start time:  0800  Appointment End time:  [phone]  Primary concerns today: PCOS, weight management  Referral diagnosis: PCOS Preferred learning style: no preference indicated Learning readiness: ready  NUTRITION ASSESSMENT   Anthropometrics  Not assessed   Clinical Medical Hx: reviewed Medications: reviewed Labs: A1c 5.9% 05/09/20 Notable Signs/Symptoms: Continues to have restless sleep. PCOS - chin hair 3/4 on Ferriman-Gallwey scale for hirsutism, irregular periods IBS symptoms starting 2014-15 - Loose stools to diarrhea, upper left cramping, gas, urgency after eating  Lifestyle & Dietary Hx Pt reports changes including reduced screen time, eating more fruit, cut down on snacking. Pt reports her water intake is getting better, but still mostly in the evenings. Pt states drinking water in the morning can upset her stomach. Pt reports keeping more fruit in the house but wasn't sure if it would be good to eat because of the natural sugar.  Patient states sleep has become more restless likely due to increased stress over financial concerns.   Physical activity includes walking her dog in the evenings around a field a couple of times for ~25 minutes. Pt states she has stairs in her house.  Estimated daily fluid intake: 48 oz Supplements: none Sleep: 8-12 hrs; turning in sleep wakes up completely has a hard time falling back asleep Stress / self-care: (from initial visit: 9/10; breathing exercises 3x week, wine or TV) Current average weekly physical activity: walking dog ~20 min  24-Hr Dietary Recall First Meal (9:30-11): sausage, grits, hashbrowns Snack:  Second Meal:  Snack:  Third Meal: gumbo, sausage, chili, left over chicken nuggets, fries Snack:  Beverages: water, juice, soda  Estimated Energy Needs Calories: 2100   NUTRITION DIAGNOSIS  NI-5.8.3 Inappropriate intake of types of carbohydrates (specify): relying on chips and other  non-nutritous foods for snacks As related to snacking rather than structured meals.  As evidenced by dietary recall.   NUTRITION INTERVENTION  Nutrition education (E-1) on the following topics:  Factors that affect quality of sleep  Appropriate fruit intake Timing of dinner  Handouts Provided Include none  Learning Style & Readiness for Change Teaching method utilized: Visual & Auditory  Demonstrated degree of understanding via: Teach Back  Barriers to learning/adherence to lifestyle change: none  Goals Established by Pt Aim to have your dinner and eating done by 7:30 pm  Hopefully that will make it easier to drink water in the morning Aim to eat breakfast within 3 hours of waking up. Continue walking in the evening and aim to get your heart rate up for continued time of about 15 min. Train your dog to keep walking before letting him sniff.  Fruit is good to include and think about having protein with it.  MONITORING & EVALUATION Dietary intake, weekly physical activity, and sleep in 4-6 weeks.  Next Steps  Patient is to work on goals to get more structure in the evening including exercise to help get better sleep.

## 2020-07-29 NOTE — Patient Instructions (Addendum)
[email or web address] [employer] vagus nerve exercises  Goals: Aim to have your dinner and eating done by 7:30 pm  Hopefully that will make it easier to drink water in the morning Aim to eat breakfast within 3 hours of waking up. Continue walking in the evening and aim to get your heart rate up for continued time of about 15 min. Train your dog to keep walking before letting him sniff.  Fruit is good to include and think about having protein with it.

## 2020-08-03 ENCOUNTER — Ambulatory Visit (HOSPITAL_COMMUNITY)
Admission: RE | Admit: 2020-08-03 | Discharge: 2020-08-03 | Disposition: A | Discharge status: Home / Self Care | Payer: 59 | Source: Ambulatory Visit | Attending: Family Medicine | Admitting: Family Medicine

## 2020-08-03 ENCOUNTER — Other Ambulatory Visit: Payer: Self-pay

## 2020-08-03 DIAGNOSIS — R1011 Right upper quadrant pain: Secondary | ICD-10-CM | POA: Insufficient documentation

## 2020-08-04 ENCOUNTER — Other Ambulatory Visit: Payer: Self-pay

## 2020-08-08 ENCOUNTER — Ambulatory Visit (INDEPENDENT_AMBULATORY_CARE_PROVIDER_SITE_OTHER): Payer: 59 | Admitting: Family Medicine

## 2020-08-08 ENCOUNTER — Other Ambulatory Visit: Payer: Self-pay

## 2020-08-08 ENCOUNTER — Encounter: Payer: Self-pay | Admitting: Family Medicine

## 2020-08-08 VITALS — BP 131/80 | HR 93 | Ht 66.0 in | Wt 357.2 lb

## 2020-08-08 DIAGNOSIS — L732 Hidradenitis suppurativa: Secondary | ICD-10-CM

## 2020-08-08 DIAGNOSIS — R351 Nocturia: Secondary | ICD-10-CM

## 2020-08-08 MED ORDER — DOXYCYCLINE HYCLATE 100 MG PO TABS: 100.0000 mg | ORAL_TABLET | Freq: Two times a day (BID) | ORAL | 0 refills | Status: DC

## 2020-08-08 NOTE — Progress Notes (Signed)
SUBJECTIVE:   CHIEF COMPLAINT: axillary abscess HPI:   Roberta Rodriguez is a 30 y.o. yo with history notable for obesity, recurrent abscesses and mood disorder presenting for left axillary abscess and nocturia.  The patient has a history of recurrent abscesses, likely due to hidradenitis.  She reports over the last week with start her menses she has gotten several new abscesses (common trigger fro her).  She has 1 in her left axilla is particular painful.  She has 2 others nearby.  She denies fevers, chills, arm pain numbness or tingling.  She reports she is eating and drinking well.  She has just started her menses.  She is using topical clindamycin but has continued to have recurrent abscesses.  In addition to above the patient reports a longstanding history of nocturia since she was a teenager she is hard to get up 5 or more times per night to use the bathroom.  She does not drink excess soda.  She does not drink tea.  She drinks mostly water.  Even if she limits her water during the day she still urinates in excess at night.  She had a previously slightly elevated A1c in the prediabetic range and a normal metabolic panel.  She reports has been evaluated for this previously but not by urologist.  She does endorse some symptoms of sleep apnea and has an upcoming sleep study for this.  The patient reports overall low energy.  She reports even though she has increased her activity level she feels more tired.  She has been walking her dog more and continues to have fatigue.  She denies menorrhagia, rectal bleeding, palpitations, syncope, chest pain, dyspnea.  She denies heat or cold intolerance.  The patient endorses headaches 5 to 7 days/week.  These can be unilateral bilateral.  They start in the right side typically and then spread bilaterally.  They are throbbing in nature.  She has associated photophobia and phonophobia.  She sometimes feels nauseous with these.  They do often flare at the  start of her menses.  She denies vomiting, falls, numbness weakness or changes in speech.  She reports a diagnosis previously of migraines.  PERTINENT  PMH / PSH/Family/Social History : Updated and reviewed]  OBJECTIVE:   BP 131/80   Pulse 93   Ht 5\' 6"  (1.676 m)   Wt (!) 357 lb 3.2 oz (162 kg)   SpO2 98%   BMI 57.65 kg/m   Today's weight:  Last Weight  Most recent update: 08/08/2020 11:34 AM    Weight  162 kg (357 lb 3.2 oz)              Review of prior weights: Filed Weights   08/08/20 1133  Weight: (!) 357 lb 3.2 oz (162 kg)     Cardiac: Regular rate and rhythm. Normal S1/S2. No murmurs, rubs, or gallops appreciated. Lungs: Clear bilaterally to ascultation.  And left axilla there is a small 1 cm draining abscess with some purulent material without surrounding erythema and several hard nodules in the subcutaneous area surrounding this, most consistent with small abscesses not consistent with lymph nodes Psych: Pleasant and appropriate    ASSESSMENT/PLAN:   Nocturia, patient also endorses some stress urinary incontinence at times.  This could be related to her weight although differential also includes diabetes insipidus or diabetes mellitus.  Given longstanding nature and normal labs in the past this is unlikely however we will repeat today given this is worsening.  At  follow-up if this persist recommend referral to urology.  This may be in part due to her sleep disordered breathing pattern which I suspect contributes to this and her headaches.  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  She denies chance of pregnancy as she has sexual active with females.  She is on her menses.  Prescription for doxycycline.  Recommended using sunscreen.  Patient prefers to follow-up with a different dermatologist new referral placed per patient request.  Headaches, Most likely either migraine, also considered tension.  No signs or symptoms suggestive of  intracranial process.  She does have risk factors for IIH.  Consider Topamax at follow-up if not improved refer to neurology.  HCM Declined COVID vaccine, discussed     Dorris Singh, MD  [place]

## 2020-08-08 NOTE — Patient Instructions (Signed)
It was wonderful to see you today.  Please bring ALL of your medications with you to every visit.   Today we talked about:  Abscess - New referral to Dr. Mamie Nick  - I sent in Doxycycline - Please wear sunscreen with this   For fatigue - No energy patches (send me the brand in Mychart) - We will check some labs - CONGRATS on increasing activity    Thank you for choosing [facility].   Please call [phone] with any questions about today's appointment.  Please be sure to schedule follow up at the front  desk before you leave today.   Dorris Singh, MD  Family Medicine

## 2020-08-09 LAB — BASIC METABOLIC PANEL
BUN/Creatinine Ratio: 9 (ref 9–23)
BUN: 9 mg/dL (ref 6–20)
CO2: 22 mmol/L (ref 20–29)
Calcium: 9.1 mg/dL (ref 8.7–10.2)
Chloride: 107 mmol/L — ABNORMAL HIGH (ref 96–106)
Creatinine, Ser: 0.99 mg/dL (ref 0.57–1.00)
Glucose: 92 mg/dL (ref 65–99)
Potassium: 4.4 mmol/L (ref 3.5–5.2)
Sodium: 141 mmol/L (ref 134–144)
eGFR: 79 mL/min/{1.73_m2} (ref >59)

## 2020-08-09 LAB — HEMOGLOBIN A1C
Est. average glucose Bld gHb Est-mCnc: 117 mg/dL
Hgb A1c MFr Bld: 5.7 % — ABNORMAL HIGH (ref 4.8–5.6)

## 2020-08-10 ENCOUNTER — Telehealth: Payer: Self-pay | Admitting: Family Medicine

## 2020-08-10 DIAGNOSIS — N39490 Overflow incontinence: Secondary | ICD-10-CM

## 2020-08-10 DIAGNOSIS — R350 Frequency of micturition: Secondary | ICD-10-CM

## 2020-08-10 DIAGNOSIS — N393 Stress incontinence (female) (male): Secondary | ICD-10-CM

## 2020-08-10 NOTE — Telephone Encounter (Signed)
Called with results--given ongoing urinary symptoms, will send to Urology for recommendations for testing and recommendations for management

## 2020-09-06 ENCOUNTER — Ambulatory Visit (INDEPENDENT_AMBULATORY_CARE_PROVIDER_SITE_OTHER): Payer: 59 | Admitting: Pulmonary Disease

## 2020-09-06 ENCOUNTER — Encounter: Payer: Self-pay | Admitting: Pulmonary Disease

## 2020-09-06 ENCOUNTER — Other Ambulatory Visit: Payer: Self-pay

## 2020-09-06 VITALS — BP 168/98 | HR 103 | Ht 66.0 in | Wt 355.0 lb

## 2020-09-06 DIAGNOSIS — R29818 Other symptoms and signs involving the nervous system: Secondary | ICD-10-CM | POA: Diagnosis not present

## 2020-09-06 DIAGNOSIS — J4530 Mild persistent asthma, uncomplicated: Secondary | ICD-10-CM

## 2020-09-06 MED ORDER — FLUTICASONE-SALMETEROL 100-50 MCG/ACT IN AEPB: 1.0000 | INHALATION_SPRAY | Freq: Two times a day (BID) | RESPIRATORY_TRACT | 3 refills | Status: DC

## 2020-09-06 MED ORDER — AEROCHAMBER MV MISC: 0 refills | Status: DC

## 2020-09-06 NOTE — Patient Instructions (Addendum)
Mild persistent asthma --START Advair g ONE puff ONCE a day. Sample and prescription --CONTINUE Albuterol as needed for shortness of breath or wheezing --Will provide a spacer --ARRANGE pulmonary function tests  Suspected sleep apnea In the the setting of fatigue, morning headaches, nocturna and HTN, high suspicion for OSA --Scheduled for sleep study  Morbid obesity --We discussed weight loss, diet and regular exercise --Follow-up with Nutrition as scheduled  Follow-up with me in 3 months with PFTs

## 2020-09-06 NOTE — Progress Notes (Signed)
Subjective:   PATIENT ID: Roberta Rodriguez GENDER: female DOB: 1991-02-23, MRN: [id number]   HPI  Chief Complaint  Patient presents with   Consult    Asthma, sleep study scheduled    Reason for Visit: New consult for asthma  Roberta Rodriguez is a 30 year female with history asthma, restless leg syndrome, HTN who presents new consult for asthma and sleep study.  She initially was diagnosed with asthma when she was 19. She underwent sleep study 2014 which was negative for sleep apnea and restless leg syndrome. She was not treated with medications but was advised to start iron supplementation but did not tolerate to GI upset/constipation.   For her asthma, she was on as needed albuterol. Denies baseline wheezing, coughing or shortness of breath. Her last asthma attack was two weeks ago triggered by allergens including dust, pollen, tree, extreme weather changes. She has had three asthma attacks but did not need steroids or require ED visit. Improved with albuterol. Denies nocturnal symptoms.  She is a light sleeper. She goes to bed 8-9pm and wakes up 5 am but will wake up average 4 times due to urgent nocturia. She is minimizing her fluid intake at the end of the day. She wakes up with morning headaches most days of the week. Her partner has witnessed apnea. Her energy level is low. Has improved some as she is seeing therapy for her depression. Her sleep study is scheduled this week.  In the last 10 years, she reports 30 lbs weight gain. She is trying to be more active with her home gym with weights. Diet is currently not regulated with varying portion sizes. She is currently seeing a nutritionist.   She was diagnosed with hypertension this year and being managed by her PCP. Currently not on medications.  2021 Jan Feb March April May June July Aug Sept Oct Nov Dec                2022 Jan Feb March April May June July Aug Sept Oct Nov Dec      X X X         Asthma Control Test ACT  Total Score  09/06/2020 23   Social History: Never smoker. Never vapes Cannabis smoking   I have personally reviewed patient's past medical/family/social history, allergies, current medications.  Past Medical History:  Diagnosis Date   Asthma    Depression    Elevated blood pressure reading    normal 24 hour monitor    Frequent headaches    Hydradenitis    PCOS (polycystic ovarian syndrome) 2018     Family History  Problem Relation Age of Onset   Asthma Other    Depression Mother    Hypertension Mother    Other Other        Multiple Cancers in Grandparents & Aunts/uncles (Brain, Lung, Cervix)   Other Other        Heart Disease in Grandparents & Aunts/Uncles; Negative in 1st degree relatives   Diabetes Other        in Grandparents & Aunts/Uncles - no 1st degree   Hypertension Other        in Grandparents & Aunts/Uncles   Stroke Other        Aunts/Uncles; no first degree   Thyroid disease Other        Aunts/Uncles; no first degree   Hypertension Father    Breast cancer Maternal Grandmother      Social History  Occupational History   Not on file  Tobacco Use   Smoking status: Former    Pack years: 0.00    Types: Cigarettes    Start date: 02/27/2008    Quit date: 02/15/2009    Years since quitting: 11.5   Smokeless tobacco: Never  Vaping Use   Vaping Use: Never used  Substance and Sexual Activity   Alcohol use: Yes    Comment: "Once every few months"    Drug use: Yes    Types: Marijuana    Comment: started marijuana at age 13, daily   Sexual activity: Yes    Partners: Female    Birth control/protection: None    No Known Allergies   Outpatient Medications Prior to Visit  Medication Sig Dispense Refill   albuterol (VENTOLIN HFA) 108 (90 Base) MCG/ACT inhaler Inhale 1-2 puffs into the lungs every 6 (six) hours as needed for wheezing or shortness of breath. 2 each 3   clindamycin (CLEOCIN-T) 1 % external solution Apply topically 2 (two) times daily.  (Patient not taking: Reported on 09/06/2020) 30 mL 0   doxycycline (VIBRA-TABS) 100 MG tablet Take 1 tablet (100 mg total) by mouth 2 (two) times daily. (Patient not taking: Reported on 09/06/2020) 14 tablet 0   Spacer/Aero-Holding Chambers (AEROCHAMBER PLUS WITH MASK) inhaler Use as instructed (Patient not taking: Reported on 09/06/2020) 1 each 2   No facility-administered medications prior to visit.    Review of Systems  Constitutional:  Positive for malaise/fatigue. Negative for chills, diaphoresis, fever and weight loss.       Weight gain  HENT:  Negative for congestion, ear pain and sore throat.   Respiratory:  Positive for shortness of breath and wheezing. Negative for cough, hemoptysis and sputum production.   Cardiovascular:  Negative for chest pain, palpitations and leg swelling.  Gastrointestinal:  Negative for abdominal pain, heartburn and nausea.  Genitourinary:  Negative for frequency.  Musculoskeletal:  Negative for joint pain and myalgias.  Skin:  Negative for itching and rash.  Neurological:  Positive for headaches. Negative for dizziness and weakness.  Endo/Heme/Allergies:  Does not bruise/bleed easily.  Psychiatric/Behavioral:  Positive for depression. The patient is not nervous/anxious.     Objective:   Vitals:   09/06/20 1027  BP: (!) 168/98  Pulse: (!) 103  SpO2: 99%  Weight: (!) 355 lb (161 kg)  Height: 5\' 6"  (1.676 m)   SpO2: 99 %  Physical Exam: General: Well-appearing, no acute distress HENT: [place], AT Eyes: EOMI, no scleral icterus Respiratory: Clear to auscultation bilaterally.  No crackles, wheezing or rales Cardiovascular: RRR, -M/R/G, no JVD Extremities:-Edema,-tenderness Neuro: AAO x4, CNII-XII grossly intact Psych: Normal mood, normal affect  Data Reviewed:  Imaging: No chest imaging on file  PFT: No available PFTs  Labs: CBC    Component Value Date/Time   WBC 8.1 05/09/2020 1045   WBC 10.3 02/13/2016 2115   RBC 4.88 05/09/2020 1045    RBC 4.53 02/13/2016 2115   HGB 13.5 05/09/2020 1045   HCT 42.4 05/09/2020 1045   PLT 302 05/09/2020 1045   MCV 87 05/09/2020 1045   MCH 27.7 05/09/2020 1045   MCH 28.7 02/13/2016 2115   MCHC 31.8 05/09/2020 1045   MCHC 33.5 02/13/2016 2115   RDW 14.3 05/09/2020 1045   LYMPHSABS 4.3 (H) 01/04/2014 2328   MONOABS 0.5 01/04/2014 2328   EOSABS 0.1 01/04/2014 2328   BASOSABS 0.0 01/04/2014 2328   Absolute eos 01/04/14 - 100    Assessment &  Plan:   Discussion: 30 year old female with adult onset asthma who presents to establish care.  Asthma is not well controlled with greater than 2 exacerbations.  Currently not on maintenance inhalers.  Counseled on clinical course, medical management and asthma action plan for her diagnosis.  Mild persistent asthma --START Advair g ONE puff ONCE a day. Sample and prescription --CONTINUE Albuterol as needed for shortness of breath or wheezing --Will provide a spacer --ARRANGE pulmonary function tests  Suspected sleep apnea In the the setting of fatigue, morning headaches, nocturna and HTN, high suspicion for OSA --Scheduled for sleep study  Morbid obesity --We discussed weight loss, diet and regular exercise --Follow-up with Nutrition as scheduled  Health Maintenance Immunization History  Administered Date(s) Administered   PPD Test 10/18/2014, 11/27/2016, 10/23/2017, 03/02/2020   Tdap 07/01/2013   CT Lung Screen-not indicated.  No significant smoking history  Orders Placed This Encounter  Procedures   Pulmonary function test    Standing Status:   Future    Standing Expiration Date:   09/06/2021    Order Specific Question:   Where should this test be performed?    Answer:   [facility]    Order Specific Question:   Full PFT: includes the following: basic spirometry, spirometry pre & post bronchodilator, diffusion capacity (DLCO), lung volumes    Answer:   Full PFT   Meds ordered this encounter  Medications    fluticasone-salmeterol (ADVAIR DISKUS) 100-50 MCG/ACT AEPB    Sig: Inhale 1 puff into the lungs 2 (two) times daily.    Dispense:  60 each    Refill:  3   Spacer/Aero-Holding Chambers (AEROCHAMBER MV) inhaler    Sig: Use as instructed    Dispense:  1 each    Refill:  0     Return in about 3 months (around 12/07/2020).  I have spent a total time of 45-minutes on the day of the appointment reviewing prior documentation, coordinating care and discussing medical diagnosis and plan with the patient/family. Imaging, labs and tests included in this note have been reviewed and interpreted independently by me.  [place], MD [facility] 09/06/2020 10:35 AM  Office Number [phone]

## 2020-09-09 ENCOUNTER — Ambulatory Visit: Payer: 59 | Admitting: Registered"

## 2020-09-09 ENCOUNTER — Ambulatory Visit (HOSPITAL_BASED_OUTPATIENT_CLINIC_OR_DEPARTMENT_OTHER): Payer: 59 | Attending: Family Medicine | Admitting: Internal Medicine

## 2020-09-09 VITALS — Ht 66.5 in | Wt 361.0 lb

## 2020-09-09 DIAGNOSIS — G4719 Other hypersomnia: Secondary | ICD-10-CM | POA: Diagnosis present

## 2020-09-09 DIAGNOSIS — R0683 Snoring: Secondary | ICD-10-CM | POA: Diagnosis not present

## 2020-09-17 DIAGNOSIS — G4719 Other hypersomnia: Secondary | ICD-10-CM

## 2020-09-17 NOTE — Procedures (Signed)
    Patient Name: Roberta Rodriguez, Roberta Rodriguez Date: 09/09/2020 Gender: Female D.O.B: 04/29/1990 Age (years): 30 Referring Provider: Martyn Malay Height (inches): 28 Interpreting Physician: Baird Lyons MD, ABSM Weight (lbs): 361 RPSGT: Baxter Flattery BMI: 52 MRN: [id number] Neck Size: 18.00  CLINICAL INFORMATION Sleep Study Type: NPSG Indication for sleep study: Fatigue, Hypertension, Obesity, Snoring, Witnesses Apnea / Gasping During Slee Epworth Sleepiness Score: 14  SLEEP STUDY TECHNIQUE As per the AASM Manual for the Scoring of Sleep and Associated Events v2.3 (April 2016) with a hypopnea requiring 4% desaturations.  The channels recorded and monitored were frontal, central and occipital EEG, electrooculogram (EOG), submentalis EMG (chin), nasal and oral airflow, thoracic and abdominal wall motion, anterior tibialis EMG, snore microphone, electrocardiogram, and pulse oximetry.  MEDICATIONS Medications self-administered by patient taken the night of the study : none reported  SLEEP ARCHITECTURE The study was initiated at 10:53:31 PM and ended at 4:49:14 AM.  Sleep onset time was 22.8 minutes and the sleep efficiency was 81.9%%. The total sleep time was 291.4 minutes.  Stage REM latency was 247.5 minutes.  The patient spent 2.6%% of the night in stage N1 sleep, 82.2%% in stage N2 sleep, 10.8%% in stage N3 and 4.5% in REM.  Alpha intrusion was absent.  Supine sleep was 5.87%.  RESPIRATORY PARAMETERS The overall apnea/hypopnea index (AHI) was 2.7 per hour. There were 4 total apneas, including 4 obstructive, 0 central and 0 mixed apneas. There were 9 hypopneas and 10 RERAs.  The AHI during Stage REM sleep was 13.8 per hour.  AHI while supine was 0.0 per hour.  The mean oxygen saturation was 96.9%. The minimum SpO2 during sleep was 92.0%.  loud snoring was noted during this study.  CARDIAC DATA The 2 lead EKG demonstrated sinus rhythm. The mean heart rate was 84.2  beats per minute. Other EKG findings include: None.  LEG MOVEMENT DATA The total PLMS were 0 with a resulting PLMS index of 0.0. Associated arousal with leg movement index was 0.0 .  IMPRESSIONS - No significant obstructive sleep apnea occurred during this study (AHI = 2.7/h). - The patient had minimal or no oxygen desaturation during the study (Min O2 = 92.0%) - The patient snored with loud snoring volume. - No cardiac abnormalities were noted during this study. - Clinically significant periodic limb movements did not occur during sleep. No significant associated arousals.  DIAGNOSIS - Primary Snoring  RECOMMENDATIONS - Manage for symptoms and snoring based on clinical judgment. - Sleep hygiene should be reviewed to assess factors that may improve sleep quality. - Weight management and regular exercise should be initiated or continued if appropriate.  [Electronically signed] 09/17/2020 11:37 AM  Baird Lyons MD, ABSM Diplomate, American Board of Sleep Medicine   NPI: [id number]                         [place] [place] [facility]  ELECTRONICALLY SIGNED ON:  09/17/2020, 11:35 AM [place] PH: [phone]   FX: [phone] [place]

## 2020-10-03 ENCOUNTER — Encounter: Payer: 59 | Attending: Obstetrics and Gynecology | Admitting: Registered"

## 2020-10-03 DIAGNOSIS — E282 Polycystic ovarian syndrome: Secondary | ICD-10-CM | POA: Insufficient documentation

## 2020-10-10 ENCOUNTER — Other Ambulatory Visit: Payer: Self-pay

## 2020-10-10 ENCOUNTER — Encounter: Payer: Self-pay | Admitting: Pulmonary Disease

## 2020-10-10 ENCOUNTER — Ambulatory Visit (INDEPENDENT_AMBULATORY_CARE_PROVIDER_SITE_OTHER): Payer: 59 | Admitting: Family Medicine

## 2020-10-10 ENCOUNTER — Encounter: Payer: Self-pay | Admitting: Family Medicine

## 2020-10-10 DIAGNOSIS — L02419 Cutaneous abscess of limb, unspecified: Secondary | ICD-10-CM | POA: Diagnosis not present

## 2020-10-10 DIAGNOSIS — R351 Nocturia: Secondary | ICD-10-CM | POA: Diagnosis not present

## 2020-10-10 MED ORDER — WEGOVY 0.25 MG/0.5ML ~~LOC~~ SOAJ
0.2500 mg | SUBCUTANEOUS | 0 refills | Status: DC
Start: 2020-10-10 — End: 2022-01-22

## 2020-10-10 MED ORDER — INSULIN PEN NEEDLE 31G X 8 MM MISC: 0 refills | Status: DC

## 2020-10-10 NOTE — Assessment & Plan Note (Addendum)
Discussed at length.  Congratulated on lifestyle changes.  Prescription for weekly.  Given her nausea we discussed at length the side effects of this medication.  Prescription sent to pharmacy to see cost.  Information given.  Number given to healthy weight and wellness for patient to call to schedule visit.  Encouraged her to follow-up with nutrition to schedule meal plans as she desires.  We also discussed bariatric surgery.  She would not like to consider that at this time.  Follow-up in 4 weeks for medication check.

## 2020-10-10 NOTE — Progress Notes (Signed)
SUBJECTIVE:   CHIEF COMPLAINT: weight and headaches  HPI:   Roberta Rodriguez is a 30 y.o. yo with history notable for obesity, frequent headaches, nocturia and suspected asthma presenting for follow-up and discuss weight loss..   The patient is recently started seeing a nutritionist.  She is increased her water during the day and decreased at night.  She has not lost any weight and is disappointed.  She is not interested in surgery at this time but is considering a medication.  She would like to see healthy weight and wellness.  She has injected medications before and is interested in a weekly injection if possible.  The patient reports her intermittent nausea and vomiting have improved.  She did have nausea about a week and a half ago.  This was relieved when she had a bowel movement.  She endorses some constipation.  She denies hematochezia or melena or unintentional weight loss.  She reports her abdominal pain is improved.  The patient reports her headaches are improved.  She no longer has daytime headaches.  Patient reports intermittent chest tightness for about 30 seconds after use of her combination Advair medication.  She endorses some palpitations for about 30 seconds as well.  This happens only when she uses medication.  She reports endorses reproducibility to the chest pain.  She denies chest pain with exertion left-sided chest pain or radiating chest pain.   The patient reports her nocturia persists.  She had urodynamic studies done.  She tried Myrbetriq without relief.  She denies dysuria or hematuria.  PERTINENT  PMH / PSH/Family/Social History : Updated and reviewed as appropriate  OBJECTIVE:   BP 115/70   Pulse 85   Wt (!) 352 lb 3.2 oz (159.8 kg)   SpO2 100%   BMI 55.99 kg/m   Today's weight:  Last Weight  Most recent update: 10/10/2020  8:27 AM    Weight  159.8 kg (352 lb 3.2 oz)              Review of prior weights: [employer]   10/10/20 0827   Weight: (!) 352 lb 3.2 oz (159.8 kg)   Pleasant and appropriate.  Regular rate and rhythm.  No murmurs rubs or gallops.  Lungs clear bilaterally to auscultation there is no wheezes rales or rhonchi. ASSESSMENT/PLAN:   Obesity, morbid ([place]) Discussed at length.  Congratulated on lifestyle changes.  Prescription for weekly.  Given her nausea we discussed at length the side effects of this medication.  Prescription sent to pharmacy to see cost.  Information given.  Number given to healthy weight and wellness for patient to call to schedule visit.  Encouraged her to follow-up with nutrition to schedule meal plans as she desires.  We also discussed bariatric surgery.  She would not like to consider that at this time.  Follow-up in 4 weeks for medication check.    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  None are present today.  She desires to see Dr. Lamount Cohen at [facility] referral placed.  Intermittent chest tightness reproducible on exam.  Suspect this is due to recent lifting of multiple desks.  Could be medication side effect we will continue to monitor.  She has upcoming pulmonary function testing.  Intermittent headaches with nocturia given the volume of water she is drinking and the amount of nocturia it could be  possible she has a component of diabetes insipidus.  I think this is less likely.  If her headaches persist will obtain imaging and refer to neurology.  These are actually improved today.  Discussed reasons to call and return to care.  HCM Declined COVID vaccine   A1c due at follow up    Dorris Singh, [place]

## 2020-10-10 NOTE — Patient Instructions (Addendum)
For healthy weight and wellness call: Address: [address], [place], [place] [address] Hours:  Open ? Closes 6PM Confirmed by this business 1 week ago Phone: [phone]  You are due for an A1C in 3 months.  Please follow up in 4 weeks for a medication check   I sent a referral to your preferred Dermatologist   I sent Wegovy to your pharmacy  This is a once weekly injection I also sent small needles to put on the pens  It was wonderful to see you today.   Thank you for choosing [facility].   Please call [phone] with any questions about today's appointment.  Please be sure to schedule follow up at the front  desk before you leave today.  3 months   Dorris Singh, MD  Family Medicine    LET ME KNOW IF YOU HAVE NAUSEA FROM THE [facility]   What are some things I need to know or do while I take this drug? For all uses of this drug:  Tell all of your health care providers that you take this drug. This includes your doctors, nurses, pharmacists, and dentists.  Follow the diet and workout plan that your doctor told you about.  Have blood work checked as you have been told by the doctor. Talk with the doctor.  Talk with your doctor before you drink alcohol.  Kidney problems have happened. Sometimes, these may need to be treated in the hospital or with dialysis.  If you cannot drink liquids by mouth or if you have upset stomach, throwing up, or diarrhea that does not go away; you need to avoid getting dehydrated. Contact your doctor to find out what to do. Dehydration may lead to new or worse kidney problems.  Do not share pen or cartridge devices with another person even if the needle has been changed. Sharing these devices may pass infections from one person to another. This includes infections you may not know you have.  If you are planning on getting pregnant, talk with your doctor. You may need to stop taking this drug at least 2 months before getting  pregnant. If using for high blood sugar:  Wear disease medical alert ID (identification).  Check your blood sugar as you have been told by your doctor.  Do not drive if your blood sugar has been low. There is a greater chance of you having a crash.  It may be harder to control blood sugar during times of stress such as fever, infection, injury, or surgery. A change in physical activity, exercise, or diet may also affect blood sugar.  Tell your doctor if you are pregnant, plan on getting pregnant, or are breast-feeding. You will need to talk about the benefits and risks to you and the baby. If using for weight loss:  If you have high blood sugar (diabetes), you will need to watch your blood sugar closely.  Weight loss during pregnancy may cause harm to the unborn baby. If you get pregnant while taking this drug or if you want to get pregnant, call your doctor right away.  Tell your doctor if you are breast-feeding. You will need to talk about any risks to your baby. What are some side effects that I need to call my doctor about right away? WARNING/CAUTION: Even though it may be rare, some people may have very bad and sometimes deadly side effects when taking a drug. Tell your doctor or get medical help right away if  you have any of the following signs or symptoms that may be related to a very bad side effect: For all uses of this drug:  Signs of an allergic reaction, like rash; hives; itching; red, swollen, blistered, or peeling skin with or without fever; wheezing; tightness in the chest or throat; trouble breathing, swallowing, or talking; unusual hoarseness; or swelling of the mouth, face, lips, tongue, or throat.  Signs of kidney problems like unable to pass urine, change in how much urine is passed, blood in the urine, or a big weight gain.  Signs of gallbladder problems like pain in the upper right belly area, right shoulder area, or between the shoulder blades; change in stools; dark urine or  yellow skin or eyes; or fever with chills.  Very bad dizziness or passing out.  A fast heartbeat.  Change in eyesight.  Low blood sugar can happen. The chance may be raised when this drug is used with other drugs for diabetes. Signs may be dizziness, headache, feeling sleepy or weak, shaking, fast heartbeat, confusion, hunger, or sweating. Call your doctor right away if you have any of these signs. Follow what you have been told to do for low blood sugar. This may include taking glucose tablets, liquid glucose, or some fruit juices.  Severe and sometimes deadly pancreas problems (pancreatitis) have happened with this drug. Call your doctor right away if you have severe stomach pain, severe back pain, or severe upset stomach or throwing up. If using for weight loss:  New or worse behavior or mood changes like depression or thoughts of suicide. What are some other side effects of this drug? All drugs may cause side effects. However, many people have no side effects or only have minor side effects. Call your doctor or get medical help if any of these side effects or any other side effects bother you or do not go away: If using for high blood sugar:  Constipation, diarrhea, stomach pain, upset stomach, or throwing up. Tablets:  Not hungry. If using for weight loss:  Constipation, diarrhea, stomach pain, upset stomach, or throwing up.  Headache.  Feeling dizzy, tired, or weak.  Burping.  Gas. These are not all of the side effects that may occur. If you have questions about side effects, call your doctor. Call your doctor for medical advice about side effects. You may report side effects to your national health agency. How is this drug best taken? Use this drug as ordered by your doctor. Read all information given to you. Follow all instructions closely. Tablets:  Take at least 30 minutes before the first food, drink, or drugs of the day.  Take with plain water only. Do not take with more than 4  ounces (120 mL) of water.  Swallow whole. Do not chew, break, or crush.  Keep taking this drug as you have been told by your doctor or other health care provider, even if you feel well. Prefilled syringes or pen:  It is given as a shot into the fatty part of the skin on the top of the thigh, belly area, or upper arm.  If you will be giving yourself the shot, your doctor or nurse will teach you how to give the shot.  Take with or without food.  Take the same day each week.  Move the site where you give the shot with each shot.  Do not use if the solution is cloudy, leaking, or has particles.  Do not use if solution changes color.  Wash your hands before and after use.  Keep taking this drug as you have been told by your doctor or other health care provider, even if you feel well.  Throw away needles in a needle/sharp disposal box. Do not reuse needles or other items. When the box is full, follow all local rules for getting rid of it. Talk with a doctor or pharmacist if you have any questions. If using for high blood sugar:  Attach new needle before each dose.  Take off the needle after each shot. Do not store this device with the needle on it.  Put the cap back on after you are done using your dose.  If you are also using insulin, you may inject this drug and the insulin in the same area of the body but not right next to each other.  Do not mix this drug in the same syringe with insulin. If using for weight loss:  Each container is for one use only. Use right after opening. Throw away any part of the opened container after the dose is given. What do I do if I miss a dose? Tablets:  Skip the missed dose and go back to your normal time.  Do not take 2 doses at the same time or extra doses. Prefilled syringes or pen:  Take a missed dose as soon as you think about it and go back to your normal time.  If it is less than 48 hours until your next dose, skip the missed and go back to your normal  time.  Do not take 2 doses within 48 hours of each other.  If you miss 2 doses, call your doctor. How do I store and/or throw out this drug? Tablets:  Store in the original container at room temperature.  Store in a dry place. Do not store in a bathroom. Prefilled syringes or pen:  Store unopened pens in a refrigerator. Do not freeze.  Do not use if it has been frozen. Ozempic prefilled pens:  After opening, store in the refrigerator or at room temperature. Throw away any part not used after 56 days.  Protect from heat and light. Wegovy prefilled pens:  You may store unopened containers at room temperature. If you store at room temperature, throw away any part not used after 28 days.  Store in the original container to protect from light. All products:  Keep all drugs in a safe place. Keep all drugs out of the reach of children and pets.  Throw away unused or expired drugs. Do not flush down a toilet or pour down a drain unless you are told to do so. Check with your pharmacist if you have questions about the best way to throw out drugs. There may be drug take-back programs in your area. General drug facts  If your symptoms or health problems do not get better or if they become worse, call your doctor.  Do not share your drugs with others and do not take anyone else's drugs.  Some drugs may have another patient information leaflet. If you have any questions about this drug, please talk with your doctor, nurse, pharmacist, or other health care provider.  If you think there has been an overdose, call your poison control center or get medical care right away. Be ready to tell or show what was taken, how much, and when it happened.

## 2020-10-11 ENCOUNTER — Encounter: Payer: Self-pay | Admitting: Family Medicine

## 2020-11-02 ENCOUNTER — Ambulatory Visit: Payer: 59 | Admitting: Physician Assistant

## 2021-03-10 ENCOUNTER — Ambulatory Visit (INDEPENDENT_AMBULATORY_CARE_PROVIDER_SITE_OTHER): Payer: 59 | Admitting: Family Medicine

## 2021-03-10 ENCOUNTER — Other Ambulatory Visit: Payer: Self-pay

## 2021-03-10 VITALS — BP 138/98 | HR 92 | Ht 66.0 in | Wt 354.1 lb

## 2021-03-10 DIAGNOSIS — J040 Acute laryngitis: Secondary | ICD-10-CM

## 2021-03-10 NOTE — Progress Notes (Signed)
° ° °  SUBJECTIVE:   CHIEF COMPLAINT / HPI:   Sore throat started 6 days ago. She saw a telehealth doctor who prescribed her cetirizine, dexamethasone, Flonase, benzonatate, and naproxen. Also taken Theraflu. Voice hoarseness started yesterday, improved today. Has since developed cough, congestion. No fever or chills, SOB. Has not been using albuterol inhaler more. Home COVID test was negative.  PERTINENT  PMH / PSH: Mild persistent asthma, obesity  OBJECTIVE:   BP (!) 138/98    Pulse 92    Ht 5\' 6"  (1.676 m)    Wt (!) 354 lb 2 oz (160.6 kg)    LMP 02/22/2021    SpO2 99%    BMI 57.16 kg/m   General: Obese young female, NAD HEENT: MMM, posterior oropharynx clear, no maxillary or frontal sinus tenderness CV: RRR, no murmurs Pulm: CTAB, no wheezes or rales Abdomen: Soft, nontender  ASSESSMENT/PLAN:   Viral laryngitis Symptoms consistent with viral illness, low suspicion for bacterial etiology.  Voice hoarseness is improving.  Recommended supportive measures with humidifier, resting voice, and hydration.   Zola Button, MD [place]

## 2021-03-10 NOTE — Patient Instructions (Addendum)
It was nice seeing you today!  Recommend honey for cough.  Recommend using humidifier, resting your voice, and staying hydrated.  Let us know if things aren't getting better.  Stay well, Roberta Button, MD [place] [phone]  --  Make sure to check out at the front desk before you leave today.  Please arrive at least 15 minutes prior to your scheduled appointments.  If you had blood work today, I will send you a MyChart message or a letter if results are normal. Otherwise, I will give you a call.  If you had a referral placed, they will call you to set up an appointment. Please give [place] a call if you don't hear back in the next 2 weeks.  If you need additional refills before your next appointment, please call your pharmacy first.

## 2021-03-20 ENCOUNTER — Ambulatory Visit: Payer: 59 | Admitting: Family Medicine

## 2021-04-03 ENCOUNTER — Other Ambulatory Visit: Payer: Self-pay

## 2021-04-03 ENCOUNTER — Ambulatory Visit (INDEPENDENT_AMBULATORY_CARE_PROVIDER_SITE_OTHER): Payer: 59 | Admitting: Family Medicine

## 2021-04-03 ENCOUNTER — Encounter: Payer: Self-pay | Admitting: Family Medicine

## 2021-04-03 VITALS — BP 151/84 | HR 87 | Wt 351.6 lb

## 2021-04-03 DIAGNOSIS — F39 Unspecified mood [affective] disorder: Secondary | ICD-10-CM

## 2021-04-03 DIAGNOSIS — Z0000 Encounter for general adult medical examination without abnormal findings: Secondary | ICD-10-CM

## 2021-04-03 DIAGNOSIS — K529 Noninfective gastroenteritis and colitis, unspecified: Secondary | ICD-10-CM

## 2021-04-03 MED ORDER — ONDANSETRON 4 MG PO TBDP: 4.0000 mg | ORAL_TABLET | Freq: Three times a day (TID) | ORAL | 0 refills | Status: DC | PRN

## 2021-04-03 NOTE — Progress Notes (Signed)
° ° °  SUBJECTIVE:   Chief compliant/HPI: annual examination  Roberta Rodriguez is a 31 y.o. who presents today for an annual exam.   Asthma Reports she rarely uses inhaler. No dyspnea, chest pain GI Illness At at [employer] last night. Had her usual plus scallops. Has had 14 hours of nausea, vomiting (X2), diarrhea (non bloody). Able to keep down fluids. Persistent symptoms include mild abdominal pain, nausea. No fevers, back pain, blood in stool. Has tried nothing.   Review of systems form notable for As above.   Updated history tabs and problem list yes.  Working at [employer] with partner Roberta Rodriguez is doing well--walks him for exercise Seeing a therapist and mood improved   OBJECTIVE:   BP (!) 151/84    Pulse 87    Wt (!) 351 lb 9.6 oz (159.5 kg)    SpO2 100%    BMI 56.75 kg/m   HEENT: EOMI. Sclera without injection or icterus. MMM. External auditory canal examined and WNL. TM normal appearance, no erythema or bulging. Neck: Supple.  Cardiac: Regular rate and rhythm. Normal S1/S2. No murmurs, rubs, or gallops appreciated. Lungs: Clear bilaterally to ascultation.  Abdomen: Normoactive bowel sounds. Minimally tender in LUQ  No rebound or guarding.  Psych: Pleasant and appropriate    ASSESSMENT/PLAN:   Gastroenteritis, also considered food allergy, pancreatitis, C. Difficile, less likely UTI. Recommended supportive care, Rx Zofran.    Elevated BP Reading,  Known HTN in clinic Prior 24 hour BP normal  She is to monitor at home Follow up 2 months Improved on repeat NO symptoms   Nocturia, evaluated by Urology, minimally improved with Mybetriq---recommended following up--reports weight has large part of this  Obesity Discussed, insurance does not cover medications for obesity, increasing activity, dietary changes discussed   Annual Examination  See AVS for age appropriate recommendations.  PHQ reviewed  Blood pressure reviewed and at goal .  Asked about  intimate partner violence and patient reports none.     Considered the following items based upon USPSTF recommendations: HIV testing: discussed Hepatitis C: discussed Hepatitis B: discussed Syphilis if at high risk: discussed GC/CT not at high risk and not ordered. Lipid panel (nonfasting or fasting) discussed based upon AHA recommendations and ordered.  Consider repeat every 4-6 years.  Reviewed risk factors for latent tuberculosis and not indicated  Discussed family history, BRCA testing not indicated. Tool used to risk stratify was Pedigree Assessment tool   Cervical cancer screening: prior Pap reviewed, repeat due in 2025 Immunizations Discussed covid, flu  and PCV 20   Follow up 2 months   [place], MD [place]

## 2021-04-03 NOTE — Patient Instructions (Addendum)
It was wonderful to see you today.  Please bring ALL of your medications with you to every visit.   Today we talked about:  Today at your annual preventive visit we talked about the following measures:   I recommend 150 minutes of exercise per week-try 30 minutes 5 days per week Keep up the great work with walking your dog  I will send you a message on MyChart about blood work   I sent in [place] to your pharmacy--this dissolves under the tongue--you can use 2-3 times today (every 6-8 hours)   Please PUSH fluids Start a bland diet tomorrow (think plain, non-spicey, non -dairy food)   Thank you for choosing [facility].   Please call [phone] with any questions about today's appointment.  Please be sure to schedule follow up at the front  desk before you leave today.   Dorris Singh, MD  Family Medicine

## 2021-04-04 LAB — LIPID PANEL
Chol/HDL Ratio: 5 ratio — ABNORMAL HIGH (ref 0.0–4.4)
Cholesterol, Total: 213 mg/dL — ABNORMAL HIGH (ref 100–199)
HDL: 43 mg/dL (ref >39)
LDL Chol Calc (NIH): 148 mg/dL — ABNORMAL HIGH (ref 0–99)
Triglycerides: 122 mg/dL (ref 0–149)
VLDL Cholesterol Cal: 22 mg/dL (ref 5–40)

## 2021-04-04 LAB — HEMOGLOBIN A1C
Est. average glucose Bld gHb Est-mCnc: 126 mg/dL
Hgb A1c MFr Bld: 6 % — ABNORMAL HIGH (ref 4.8–5.6)

## 2021-05-23 ENCOUNTER — Ambulatory Visit (INDEPENDENT_AMBULATORY_CARE_PROVIDER_SITE_OTHER): Payer: 59 | Admitting: Family Medicine

## 2021-05-23 ENCOUNTER — Encounter: Payer: Self-pay | Admitting: Family Medicine

## 2021-05-23 ENCOUNTER — Other Ambulatory Visit: Payer: Self-pay

## 2021-05-23 VITALS — BP 156/97 | HR 103 | Wt 355.4 lb

## 2021-05-23 DIAGNOSIS — L819 Disorder of pigmentation, unspecified: Secondary | ICD-10-CM | POA: Diagnosis not present

## 2021-05-23 DIAGNOSIS — M79672 Pain in left foot: Secondary | ICD-10-CM

## 2021-05-23 MED ORDER — DICLOFENAC SODIUM 1 % EX GEL: 2.0000 g | Freq: Four times a day (QID) | CUTANEOUS | 1 refills | Status: DC

## 2021-05-23 NOTE — Patient Instructions (Signed)
It was wonderful to see you today. ? ?Please bring ALL of your medications with you to every visit.  ? ?Today we talked about: ? ?Left foot pain-please schedule follow-up with our dermatology clinic before you leave today.  We are concerned about the spot that on the inside of your left foot.  For the pain it may be due to planter fasciitis for this we discussed tennis ball massages a frozen water bottle and stretches as demonstrated.  I have also prescribed Voltaren gel to help with inflammation and pain.  By mouth you can also take Tylenol for pain if desired.  Follow-up in 2 weeks ? ?Please be sure to schedule follow up at the front  desk before you leave today.  ? ?If you haven't already, sign up for My Chart to have easy access to your labs results, and communication with your primary care physician. ? ?Please call the clinic at [phone] if your symptoms worsen or you have any concerns. It was our pleasure to serve you. ? ?Dr. Janus Molder ? ?

## 2021-05-23 NOTE — Progress Notes (Signed)
? ? ?  SUBJECTIVE:  ? ?CHIEF COMPLAINT / HPI:  ? ?Left foot pain ?Starting 2 weeks ago. Noticed a dark patch on the bottom of her foot that is now growing in size that she feels coincides with her foot pain that is worsening.  Before pain would improve when she was off of her feet and with pain cream and massages.  Now she is having pain with standing and no relief from pain cream or massages.  She works in a [employer].  She denies fever, numbness or tingling in her toes or feet, as well as loss of sensation.  When questioned about the dark spot on the inside of her left foot she states that it has been there since birth but she has noticed that it has grown over the years.  She has not been seen for dermatologist for this. ? ?PERTINENT  PMH / PSH: Atypical pigmented lesion (refer to dermatology 07/22/2020) ? ?OBJECTIVE:  ? ?BP (!) 156/97   Pulse (!) 103   Wt (!) 355 lb 6.4 oz (161.2 kg)   SpO2 99%   BMI 57.36 kg/m?   ?General: Appears well, no acute distress. Age appropriate. ?Respiratory: normal effort ?Extremities:  ?Left Foot: ?Inspection:  No obvious bony deformity b/l.  No swelling, erythema, or bruising b/l. ?Palpation: TTP along plantar fascia ?ROM: Full  ROM of the ankle b/l. Normal midfoot flexibility b/l ?Strength: 5/5 strength ankle in all planes b/l ?Neurovascular: N/V intact distally in the lower extremity b/l ?Special tests: Negative squeeze. normal midfoot flexibility.  ?Skin: Hyperpigmented patch over lateral sole that appears consistent with foot callus. Hyperpigmented lesion on medial left heel. Color is not entirely uniform and borders are somewhat ill defined.  ?Neuro: alert and oriented ?Psych: normal affect ?  ?Media Information ?Document Information ? ?Photos  ?  ?05/23/2021 15:15  ?Attached To:  ?Office Visit on 05/23/21 with Autry-Lott, Naaman Plummer, DO  ? ?Source Information ? ?Autry-Lott, [facility] Med Resident  ? ? ?  ?Media Information ?Document Information ? ?Photos  ?   ?05/23/2021 15:23  ?Attached To:  ?Office Visit on 05/23/21 with Autry-Lott, Naaman Plummer, DO  ? ?Source Information ? ?Autry-Lott, Shaw[facility] Knapp Med Resident  ? ?ASSESSMENT/PLAN:  ? ?Left foot pain ?2 weeks of worsening foot pain.  Evidence of lateral sole callus.  Tender over plantar fasciitis now pain with walking and standing.  Discussed conservative therapy such as tennis ball massages or frozen water bottle massage and foot stretches.  Also prescribed Voltaren gel to help with inflammation and pain and instructed Tylenol by mouth for pain if desired.  Follow-up in 2 weeks if no improvement. ?- diclofenac Sodium (VOLTAREN) 1 % GEL; Apply 2 g topically 4 (four) times daily.  Dispense: 50 g; Refill: 1 ? ?Atypical pigmented lesion ?Noticed on foot exam today for the above.  Patient endorsing growth over time but also endorsing present since birth.  Per chart review was referred to dermatology 06/2020.  Does not appear that she ever followed up with dermatology.  Asked to schedule with our Derm clinic will likely need to biopsy this.  Concern for melanoma.  Also could be atypical nevus.  I stressed the importance of getting this biopsied and that this lesion has a higher probability of being a melanoma with people of African-American ethnicity. ? ? ? ?Eshika Reckart Autry-Lott, DO ?[place]  ?

## 2021-05-26 ENCOUNTER — Telehealth: Payer: Self-pay

## 2021-05-26 NOTE — Telephone Encounter (Signed)
Informed pt that voltaren gel is requiring a PA on her insurance however this medication is available OTC and can be purchased for under $10. Pt expressed understanding. ?

## 2021-05-31 NOTE — Progress Notes (Signed)
? ? ?  SUBJECTIVE:  ? ?CHIEF COMPLAINT / HPI:  ? ?Plantar fasciitis ?Following up for continued foot pain; unchanged from last visit. Seen in dermatology clinic for left foot lesion removal. Admits to not doing foot massages as previously discussed. Voltren gel did not help with pain relief. Has been referred to podiatry and plans to follow up with them. Partner is here with her today and plans to help with foot massages.  ? ?Started on lamotrigine yesterday, feeling more anxious  ?Denies SI/HI. Was seen by psych Monday. Has follow up in 3-4 wks. Concerned about side effects of medication and if it is causing her increased anxiety.  ? ?Elevated BP reading at home ?SBP 170-160s and DBP 90-110 at home according to log she brought in today. Denies vision changes, headache, chest pain, and shortness of breath.  ? ?PERTINENT  PMH / PSH: As above.  ? ?OBJECTIVE:  ? ?BP 132/80   Pulse 78   Ht '5\' 6"'$  (1.676 m)   Wt (!) 361 lb 6.4 oz (163.9 kg)   BMI 58.33 kg/m?   ? ?  06/01/2021  ?  9:08 AM 04/03/2021  ?  9:12 AM 03/10/2021  ?  9:04 AM  ?PHQ9 SCORE ONLY  ?PHQ-9 Total Score '19 9 9  '$ ? ?General: Appears well, no acute distress. Age appropriate. Partner present and attentive  ?Cardiac: RRR, normal heart sounds, no murmurs ?Respiratory: CTAB, normal effort ?Extremities: No edema or cyanosis. ?Skin: Warm and dry, no rashes noted ?Neuro: alert and oriented ?Psych: normal affect ? ?ASSESSMENT/PLAN:  ? ?Plantar fasciitis ?Seen 1 week prior for left foot pain.  ?-Massage (bottle/tennis) ?-Tylenol PRN for pain ?-Podiatry referral previously placed; follow up or consider sports medicine referral ?-Discussed could take several weeks of supportive treatment ? ?2. Mood disorder ([place]) ?- Started on lamotrigine by psych 1 day ago ?-Discussed biggest side affect would be rash; not likely taking long enough to cause an effect ?-Follow up with pscyh in 3-4 weeks unless needed sooner; we are available to mood check in if needed.  ? ?3. Elevated  blood pressure reading ?Hx of elevated in office readings previously thought to be white coat HTN. Today BP appropriate but patient endorsing elevated BP readings at home that were previously normal.  ?-Ambulatory BP monitoring with Dr. Valentina Lucks ? ?Bauer Ausborn Autry-Lott, DO ?[place]  ?

## 2021-05-31 NOTE — Patient Instructions (Addendum)
It was wonderful to see you today. ? ?Please bring ALL of your medications with you to every visit.  ? ?Today we talked about: ? ?Left foot pain.  We discussed taking Tylenol prior to massage either manually or with tennis balls/frozen bottle of water.  Follow-up with podiatry for callus on the bottom of the foot. ?Follow-up with psychiatry in 3 to 4 weeks if you start to notice a rash or any other symptoms with lamotrigine please let us know in the meantime. ?For your blood pressure schedule a visit with Dr. Valentina Lucks for ambulatory blood pressure monitoring. ? ?Please be sure to schedule follow up at the front  desk before you leave today.  ? ?If you haven't already, sign up for My Chart to have easy access to your labs results, and communication with your primary care physician. ? ?Please call the clinic at [phone] if your symptoms worsen or you have any concerns. It was our pleasure to serve you. ? ?Dr. Janus Molder ? ?

## 2021-06-01 ENCOUNTER — Ambulatory Visit (INDEPENDENT_AMBULATORY_CARE_PROVIDER_SITE_OTHER): Payer: 59 | Admitting: Family Medicine

## 2021-06-01 ENCOUNTER — Encounter: Payer: Self-pay | Admitting: Family Medicine

## 2021-06-01 VITALS — BP 132/80 | HR 78 | Ht 66.0 in | Wt 361.4 lb

## 2021-06-01 VITALS — BP 132/80 | HR 105 | Temp 97.8°F | Wt 361.4 lb

## 2021-06-01 DIAGNOSIS — L989 Disorder of the skin and subcutaneous tissue, unspecified: Secondary | ICD-10-CM | POA: Diagnosis not present

## 2021-06-01 DIAGNOSIS — F39 Unspecified mood [affective] disorder: Secondary | ICD-10-CM | POA: Diagnosis not present

## 2021-06-01 DIAGNOSIS — M79672 Pain in left foot: Secondary | ICD-10-CM

## 2021-06-01 DIAGNOSIS — M722 Plantar fascial fibromatosis: Secondary | ICD-10-CM

## 2021-06-01 DIAGNOSIS — L84 Corns and callosities: Secondary | ICD-10-CM | POA: Insufficient documentation

## 2021-06-01 DIAGNOSIS — R030 Elevated blood-pressure reading, without diagnosis of hypertension: Secondary | ICD-10-CM | POA: Diagnosis not present

## 2021-06-01 NOTE — Progress Notes (Signed)
? ? ?  SUBJECTIVE:  ? ?CHIEF COMPLAINT / HPI:  ? ?Foot lesion/Pain: ?She is here for her left heel birthmark, which has increased in size but has not changed color. She is concerned about malignant changes and would like it biopsied. ?She also c/o a thick, painful lesion on the dorsum of her left foot, which started about five months but became painful in the past four months. She stands on her feet for a prolonged period due to the nature of her work.  ? ?PERTINENT  PMH / PSH: PMHx reviewed ? ?OBJECTIVE:  ? ?BP 132/80   Pulse (!) 105   Temp 97.8 ?F (36.6 ?C)   Wt (!) 361 lb 6.4 oz (163.9 kg)   SpO2 96%   BMI 58.33 kg/m?   ?Physical Exam ?Vitals and nursing note reviewed.  ?Constitutional:   ?   Comments: Anxious - she   ?Feet:  ?   Comments: Round, hyperpigmented macule on her left medial heel. ?Linear hyperkeratotic tissue underlying her left foot from a few inches below her left 5th digit to her heel laterally. ?Psychiatric:  ?   Comments: She was anxious about the procedure and concerned about cancer, hence, she became teary  ? ? ? ? ? ? ?ASSESSMENT/PLAN:  ? 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 ?She is overly worried about skin cancer. ?I reassured her that the likelihood that this is benign is higher. ?However, we will await her biopsy results for further instructions. ?She agreed with the plan and wishes to proceed with biopsy. ?See procedure note below. ? ?Callus of foot ?Warm water soak and scrub discussed. ?Podiatry evaluation offered for second opinion. ?She is interested in referral. ?Referral placed.  ? ?Mood disorder ?She sees Dr. Janus Molder for this today.  ?  ?Skin Biopsy Procedure Note ? 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 ?POST-OP DIAGNOSIS: Same  ? ?PROCEDURE: skin biopsy ? ?Performing Physician:Ryder Man ? ? ?PROCEDURE:  ?  Punch (Size 32m) ? ?The area surrounding the skin lesion was prepared and draped in the  usual sterile manner. The lesion was removed in the  usual manner by the  biopsy method noted above. Hemostasis was assured. The patient tolerated  the procedure well. ? ?Closure:   Drysol applied and the biopsy wound was covered with a small round brown bandage ? ?Followup: The patient tolerated the procedure well without  complications.  Standard post-procedure care is explained and return  precautions are given. ?I will contact her soon with the pathology report.  ?KAndrena Mews MD ?[facility] ? ?

## 2021-06-01 NOTE — Patient Instructions (Signed)
Wound Care, Adult ?Taking care of your wound properly can help to prevent pain, infection, and scarring. It can also help your wound heal more quickly. Follow instructions from your health care provider about how to care for your wound. ?Supplies needed: ?Soap and water. ?Wound cleanser, saline, or germ-free (sterile) water. ?Gauze. ?If needed, a clean bandage (dressing) or other type of wound dressing material to cover or place in the wound. Follow your health care provider's instructions about what dressing supplies to use. ?Cream or topical ointment to apply to the wound, if told by your health care provider. ?How to care for your wound ?Cleaning the wound ?Ask your health care provider how to clean the wound. This may include: ?Using mild soap and water, a wound cleanser, saline, or sterile water. ?Using a clean gauze to pat the wound dry after cleaning it. Do not rub or scrub the wound. ?Dressing care ?Wash your hands with soap and water for at least 20 seconds before and after you change the dressing. If soap and water are not available, use hand sanitizer. ?Change your dressing as told by your health care provider. This may include: ?Cleaning or rinsing out (irrigating) the wound. ?Application of cream or topical ointment, if told by your health care provider. ?Placing a dressing over the wound or in the wound (packing). ?Covering the wound with an outer dressing. ?Leave stitches (sutures), staples, skin glue, or adhesive strips in place. These skin closures may need to stay in place for 2 weeks or longer. If adhesive strip edges start to loosen and curl up, you may trim the loose edges. Do not remove adhesive strips completely unless your health care provider tells you to do that. ?Ask your health care provider when you can leave the wound uncovered. ?Checking for infection ?Check your wound area every day for signs of infection. Check for: ?More redness, swelling, or pain. ?Fluid or blood. ?Warmth. ?Pus or  a bad smell. ? ?Follow these instructions at home ?Medicines ?If you were prescribed an antibiotic medicine, cream, or ointment, take or apply it as told by your health care provider. Do not stop using the antibiotic even if your condition improves. ?If you were prescribed pain medicine, take it 30 minutes before you do any wound care or as told by your health care provider. ?Take over-the-counter and prescription medicines only as told by your health care provider. ?Eating and drinking ?Eat a diet that includes protein, vitamin A, vitamin C, and other nutrient-rich foods to help the wound heal. ?Foods rich in protein include meat, fish, eggs, dairy, beans, and nuts. ?Foods rich in vitamin A include carrots and dark green, leafy vegetables. ?Foods rich in vitamin C include citrus fruits, tomatoes, broccoli, and peppers. ?Drink enough fluid to keep your urine pale yellow. ?General instructions ?Do not take baths, swim, or use a hot tub until your health care provider approves. Ask your health care provider if you may take showers. You may only be allowed to take sponge baths. ?Do not scratch or pick at the wound. Keep it covered as told by your health care provider. ?Return to your normal activities as told by your health care provider. Ask your health care provider what activities are safe for you. ?Protect your wound from the sun when you are outside for the first 6 months, or for as long as told by your health care provider. Cover up the scar area or apply sunscreen that has an SPF of at least 30. ?Do not   use any products that contain nicotine or tobacco. These products include cigarettes, chewing tobacco, and vaping devices, such as e-cigarettes. If you need help quitting, ask your health care provider. ?Keep all follow-up visits. This is important. ?Contact a health care provider if: ?You received a tetanus shot and you have swelling, severe pain, redness, or bleeding at the injection site. ?Your pain is not  controlled with medicine. ?You have any of these signs of infection: ?More redness, swelling, or pain around the wound. ?Fluid or blood coming from the wound. ?Warmth coming from the wound. ?A fever or chills. ?You are nauseous or you vomit. ?You are dizzy. ?You have a new rash or hardness around the wound. ?Get help right away if: ?You have a red streak of skin near the area around your wound. ?Pus or a bad smell coming from the wound. ?Your wound has been closed with staples, sutures, skin glue, or adhesive strips and it begins to open up and separate. ?Your wound is bleeding, and the bleeding does not stop with gentle pressure. ?These symptoms may represent a serious problem that is an emergency. Do not wait to see if the symptoms will go away. Get medical help right away. Call your local emergency services (911 in the [place] Do not drive yourself to the hospital. ?Summary ?Always wash your hands with soap and water for at least 20 seconds before and after changing your dressing. ?Change your dressing as told by your health care provider. ?To help with healing, eat foods that are rich in protein, vitamin A, vitamin C, and other nutrients. ?Check your wound every day for signs of infection. Contact your health care provider if you think that your wound is infected. ?This information is not intended to replace advice given to you by your health care provider. Make sure you discuss any questions you have with your health care provider. ?Document Revised: 06/21/2020 Document Reviewed: 06/21/2020 ?Elsevier Patient Education ? 2022 [employer]vier Inc[employer] ? ?

## 2021-06-01 NOTE — Assessment & Plan Note (Signed)
She sees Dr. Janus Molder for this today.  ?

## 2021-06-01 NOTE — Assessment & Plan Note (Signed)
Warm water soak and scrub discussed. ?Podiatry evaluation offered for second opinion. ?She is interested in referral. ?Referral placed.  ?

## 2021-06-01 NOTE — Assessment & Plan Note (Signed)
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 ?She is overly worried about skin cancer. ?I reassured her that the likelihood that this is benign is higher. ?However, we will await her biopsy results for further instructions. ?She agreed with the plan and wishes to proceed with biopsy. ?See procedure note below. ?

## 2021-06-09 ENCOUNTER — Telehealth: Payer: Self-pay | Admitting: Family Medicine

## 2021-06-09 DIAGNOSIS — D229 Melanocytic nevi, unspecified: Secondary | ICD-10-CM

## 2021-06-09 NOTE — Telephone Encounter (Signed)
Biopsy report discussed. Unclear about the report regarding the superficial nature - Perhaps it suggest inadequate sampling vs superficial changes that warrants close monitoring.  ?I discussed this with the patient and discussed referral to a dermatologist for close monitoring. She agreed with the plan. ? ? ? ?They show the superficial portion of volar skin with melanin pigment within the cornified layer. The pigment is ?arranged in few discrete columns. The epithelium underneath the pigment is absent. ?Note: The orderly arrangement of melanin pigment in the cornified layer is a feature of acral nevi. However, the superficial nature of the biopsy precludes adequate evaluation. Clinical follow-up is recommended. ? ?

## 2021-06-14 ENCOUNTER — Other Ambulatory Visit: Payer: Self-pay | Admitting: *Deleted

## 2021-06-14 ENCOUNTER — Ambulatory Visit (INDEPENDENT_AMBULATORY_CARE_PROVIDER_SITE_OTHER): Payer: 59 | Admitting: Podiatry

## 2021-06-14 DIAGNOSIS — Z91199 Patient's noncompliance with other medical treatment and regimen due to unspecified reason: Secondary | ICD-10-CM

## 2021-06-14 NOTE — Progress Notes (Signed)
No show

## 2021-06-26 ENCOUNTER — Ambulatory Visit: Payer: 59 | Admitting: Pharmacist

## 2021-06-30 ENCOUNTER — Ambulatory Visit: Payer: 59 | Admitting: Family Medicine

## 2021-08-01 ENCOUNTER — Encounter: Payer: Self-pay | Admitting: *Deleted

## 2022-01-22 ENCOUNTER — Other Ambulatory Visit (HOSPITAL_COMMUNITY)
Admission: RE | Admit: 2022-01-22 | Discharge: 2022-01-22 | Disposition: A | Discharge status: Home / Self Care | Payer: Commercial Managed Care - HMO | Source: Ambulatory Visit | Attending: Family Medicine | Admitting: Family Medicine

## 2022-01-22 ENCOUNTER — Ambulatory Visit (INDEPENDENT_AMBULATORY_CARE_PROVIDER_SITE_OTHER): Payer: Commercial Managed Care - HMO | Admitting: Family Medicine

## 2022-01-22 VITALS — BP 148/70 | Ht 66.0 in | Wt 353.0 lb

## 2022-01-22 DIAGNOSIS — F39 Unspecified mood [affective] disorder: Secondary | ICD-10-CM | POA: Diagnosis not present

## 2022-01-22 DIAGNOSIS — Z3009 Encounter for other general counseling and advice on contraception: Secondary | ICD-10-CM

## 2022-01-22 DIAGNOSIS — Z113 Encounter for screening for infections with a predominantly sexual mode of transmission: Secondary | ICD-10-CM | POA: Insufficient documentation

## 2022-01-22 LAB — POCT UA - MICROSCOPIC ONLY: Trichomonas, UA: POSITIVE

## 2022-01-22 LAB — POCT URINALYSIS DIP (MANUAL ENTRY)
Bilirubin, UA: NEGATIVE
Glucose, UA: NEGATIVE mg/dL
Ketones, POC UA: NEGATIVE mg/dL
Nitrite, UA: NEGATIVE
Protein Ur, POC: 30 mg/dL — AB
Spec Grav, UA: 1.025 (ref 1.010–1.025)
Urobilinogen, UA: 0.2 E.U./dL
pH, UA: 5.5 (ref 5.0–8.0)

## 2022-01-22 LAB — POCT WET PREP (WET MOUNT): Clue Cells Wet Prep Whiff POC: NEGATIVE

## 2022-01-22 LAB — POCT URINE PREGNANCY: Preg Test, Ur: NEGATIVE

## 2022-01-22 MED ORDER — MEDROXYPROGESTERONE ACETATE 150 MG/ML IM SUSP
150.0000 mg | Freq: Once | INTRAMUSCULAR | Status: AC
  Administered 2022-01-22: 150 mg via INTRAMUSCULAR

## 2022-01-22 MED ORDER — METRONIDAZOLE 500 MG PO TABS: 500.0000 mg | ORAL_TABLET | Freq: Two times a day (BID) | ORAL | 0 refills | Status: AC

## 2022-01-22 MED ORDER — LAMOTRIGINE 100 MG PO TABS: 100.0000 mg | ORAL_TABLET | Freq: Every day | ORAL | 0 refills | Status: DC

## 2022-01-22 NOTE — Patient Instructions (Addendum)
It was wonderful to see you today.  Please bring ALL of your medications with you to every visit.   Today we talked about:  Your urine pregnancy test was negative today.  -We are checking for sexually transmitted infections including chlamydia, gonorrhea, trichomonas, HIV, syphilis and hepatitis B. You did test positive for trichomonas and I have sent in an antibiotic that you should take twice a day for 7 days. Your partners will need to be treated as well. I will let you know of the other results via MyChart or telephone call. We are also checking for bacterial vaginosis and yeast, which are not sexually transmitted infections.  -You should abstain from sexual activity until fully treated.  -I would recommend returning in a couple months for a recheck. -It is always important to use barrier protection, such as condoms, to help prevent sexually transmitted infections.   -You received a depo injection today. If you decide to continue on the Depo you will need to return in 3 months. We can also discuss other forms of contraception if you decide you want to go another route.  I have refilled your Lamotrigine for 1 month and I will place a consult to our social workers to help you get connected with mental health resources/Psychiatry.    To find a therapist visit [email or web address]. At this website you will be able to filter which providers take your click your insurance as well as other filters to fit your needs. For medicine management please call your insurance to find psychiatrist in network   If you are feeling suicidal or depression symptoms worsen please immediately go to:   If you are thinking about harming yourself or having thoughts of suicide, or if you know someone who is, seek help right away. If you are in crisis, make sure you are not left alone.  If someone else is in crisis, make sure he/she/they is not left alone  Call 988 OR 1-800-273-TALK Text HOME to 741741  24 Hour  Availability for [place]  [address] [place], [place] [facility] [phone]    Other crisis resources:  [facility] [employer] (Domestic Violence, Rape & Victim Assistance [phone]  [facility]    (ONLY from 8am-4pm)    [phone]  [facility] (24/7)   [phone]  [place] National Suicide Hotline   [phone] Diamantina Monks)   Thank you for coming to your visit as scheduled. We have had a large "no-show" problem lately, and this significantly limits our ability to see and care for patients. As a friendly reminder- if you cannot make your appointment please call to cancel. We do have a no show policy for those who do not cancel within 24 hours. Our policy is that if you miss or fail to cancel an appointment within 24 hours, 3 times in a 56-monthperiod, you may be dismissed from our clinic.   Thank you for choosing [facility]   Please call [phone] any questions about today's appointment.  Please be sure to schedule follow up at the front  desk before you leave today.   ASharion Settler DO PGY-3 Family Medicine

## 2022-01-22 NOTE — Progress Notes (Signed)
SUBJECTIVE:   CHIEF COMPLAINT / HPI:   Roberta Rodriguez is a 31 y.o. female who presents to the [facility] clinic today to discuss the following concerns:   Depressed Mood Roberta Rodriguez has undergone several recent stressors including losing her job, coming out of a long-term relationship.  She has been diagnosed with bipolar disorder and borderline personality disorder.  States that she was following a psychiatrist and a therapist through an app but she is no longer able to see these providers.  She is currently on 100 mg of Lamictal, reports that her psychiatrist wanted to increase her to 150 mg but she was not ready to increase the dose.  She has felt down lately and does not often talk to any close family or friends about her feelings since she does not want to burden them.  She does intermittently have thoughts of wanting to harm herself but states that she could never bring herself to use a firearm or hurt herself.  She did have an SI attempt in 2011 via overdose with Tylenol/Motrin.  She states that she was not hospitalized at that time because her mother was afraid that CPS would be called since she has a younger brother.  She ultimately did end up seeking medical attention a few days later but states she has never been hospitalized for her mental health conditions.  On occasion she has thoughts of overdose but then recalls the way she felt when she last attempted in 2011 and this keeps her from doing it. She feels like her stomach has not been the same since.  STI check She feels that lately she has been more careless and impulsive with her sexual activity.  She has been sexually active with female partners and has not used protection.  She is not currently on any birth control.  Her last period was on 11/20.  She is interested in birth control options and would like full STI screening today.   PERTINENT  PMH / PSH: Bipolar disorder, BPD   OBJECTIVE:   BP (!) 148/70   Ht '5\' 6"'$  (1.676 m)   Wt (!)  353 lb (160.1 kg)   LMP 01/15/2022 (Exact Date)   BMI 56.98 kg/m    General: Depressed, tearful  Respiratory:normal effort Psych: Depressed affect and mood     01/22/2022   11:14 AM 06/01/2021    9:08 AM 04/03/2021    9:12 AM  Depression screen PHQ 2/9  Decreased Interest '3 3 1  '$ Down, Depressed, Hopeless 3 2 0  PHQ - 2 Score '6 5 1  '$ Altered sleeping '3 3 2  '$ Tired, decreased energy '3 3 3  '$ Change in appetite 3 3 0  Feeling bad or failure about yourself  '3 2 1  '$ Trouble concentrating '3 2 2  '$ Moving slowly or fidgety/restless 3 1 0  Suicidal thoughts 3 0 0  PHQ-9 Score '27 19 9  '$ Difficult doing work/chores Extremely dIfficult Extremely dIfficult Extremely dIfficult   ASSESSMENT/PLAN:   Mood disorder Elevated PHQ-9 score of 27 with positive question #9.  Patient has thoughts of suicide and particularly of overdose but she resists doing so given how she felt previously when she attempted back in 2011.  She does report having access to firearms (she knows people that own them, does not own them herself) but adamantly reports that she would never bring herself to harm herself and does not like guns. She has several recent life stressors that are likely worsening her mood.  She recently lost touch with there therapist and Psychiatrist. -Refilled 100 mg Lamotrigine  -Referral to [facility] to connect with mental health/psychiatry  -Resources for therapy/counseling as well as suicide hotline resources provided on AVS -F/u in 1 week with myself or PCP for mood check   Routine screening for STI (sexually transmitted infection) Urine pregnancy negative today. Wet prep was positive for Trichomonas. Testing for all STI's today. We did discuss different contraception options- patient thinks she would like the Depo or patch. She was amenable to first Depo injection today. Did discuss that this can potentially cause weight gain as well. Encouraged her to refrain from sexual intercourse until she has finished her  treatment for trichomonas and encouraged her partners to also be tested and treated. -Flagyl 500 mg BID x7 days -Await other STI tests and treat as appropriate -Recommend f/u in 2 months for recheck  -Condoms provided today -Received first depo injection today    Roberta Rodriguez, [place]

## 2022-01-22 NOTE — Assessment & Plan Note (Signed)
Urine pregnancy negative today. Wet prep was positive for Trichomonas. Testing for all STI's today. We did discuss different contraception options- patient thinks she would like the Depo or patch. She was amenable to first Depo injection today. Did discuss that this can potentially cause weight gain as well. Encouraged her to refrain from sexual intercourse until she has finished her treatment for trichomonas and encouraged her partners to also be tested and treated. -Flagyl 500 mg BID x7 days -Await other STI tests and treat as appropriate -Recommend f/u in 2 months for recheck  -Condoms provided today -Received first depo injection today

## 2022-01-22 NOTE — Assessment & Plan Note (Signed)
Elevated PHQ-9 score of 27 with positive question #9.  Patient has thoughts of suicide and particularly of overdose but she resists doing so given how she felt previously when she attempted back in 2011.  She does report having access to firearms (she knows people that own them, does not own them herself) but adamantly reports that she would never bring herself to harm herself and does not like guns. She has several recent life stressors that are likely worsening her mood. She recently lost touch with there therapist and Psychiatrist. -Refilled 100 mg Lamotrigine  -Referral to CCM to connect with mental health/psychiatry  -Resources for therapy/counseling as well as suicide hotline resources provided on AVS -F/u in 1 week with myself or PCP for mood check

## 2022-01-23 LAB — CERVICOVAGINAL ANCILLARY ONLY
Chlamydia: NEGATIVE
Comment: NEGATIVE
Comment: NORMAL
Neisseria Gonorrhea: NEGATIVE

## 2022-01-23 LAB — HEPATITIS B SURFACE ANTIGEN: Hepatitis B Surface Ag: NEGATIVE

## 2022-01-23 LAB — RPR: RPR Ser Ql: NONREACTIVE

## 2022-01-23 LAB — HIV ANTIBODY (ROUTINE TESTING W REFLEX): HIV Screen 4th Generation wRfx: NONREACTIVE

## 2022-01-24 ENCOUNTER — Telehealth: Payer: Self-pay | Admitting: *Deleted

## 2022-01-24 NOTE — Progress Notes (Unsigned)
  Care Coordination  Outreach Note  01/24/2022 Name: Roberta Rodriguez MRN: [id number] DOB: 1991-02-17   Care Coordination Outreach Attempts: An unsuccessful telephone outreach was attempted today to offer the patient information about available care coordination services as a benefit of their health plan.   Follow Up Plan:  Additional outreach attempts will be made to offer the patient care coordination information and services.   Encounter Outcome:  No Answer  [place]  Direct Dial: [phone]

## 2022-01-25 NOTE — Progress Notes (Signed)
  Care Coordination  Outreach Note  01/25/2022 Name: Roberta Rodriguez MRN: [id number] DOB: [birth year]   Care Coordination Outreach Attempts: A second unsuccessful outreach was attempted today to offer the patient with information about available care coordination services as a benefit of their health plan.     Follow Up Plan:  Additional outreach attempts will be made to offer the patient care coordination information and services.   Encounter Outcome:  No Answer  [place]  Direct Dial: [phone]

## 2022-01-25 NOTE — Progress Notes (Signed)
  Care Coordination   Note   01/25/2022 Name: KARSTEN VAUGHN MRN: [id number] DOB: [birth year]  KAHEALANI YANKOVICH is a 31 y.o. year old female who sees Martyn Malay, MD for primary care. I reached out to [name] by phone today to offer care coordination services.  Ms. Lias was given information about Care Coordination services today including:   The Care Coordination services include support from the care team which includes your Nurse Coordinator, Clinical Social Worker, or Pharmacist.  The Care Coordination team is here to help remove barriers to the health concerns and goals most important to you. Care Coordination services are voluntary, and the patient may decline or stop services at any time by request to their care team member.   Care Coordination Consent Status: Patient agreed to services and verbal consent obtained.   Follow up plan:  Telephone appointment with care coordination team member scheduled for:  01/26/22  Encounter Outcome:  Pt. Scheduled  [place]  Direct Dial: [phone]

## 2022-01-26 ENCOUNTER — Ambulatory Visit: Payer: Commercial Managed Care - HMO | Admitting: *Deleted

## 2022-01-28 ENCOUNTER — Encounter: Payer: Self-pay | Admitting: Family Medicine

## 2022-01-29 ENCOUNTER — Other Ambulatory Visit: Payer: Self-pay | Admitting: Family Medicine

## 2022-01-29 MED ORDER — FLUCONAZOLE 150 MG PO TABS: 150.0000 mg | ORAL_TABLET | Freq: Once | ORAL | 0 refills | Status: AC

## 2022-01-30 ENCOUNTER — Ambulatory Visit: Payer: Self-pay | Admitting: *Deleted

## 2022-01-31 NOTE — Patient Instructions (Signed)
Visit Information  Thank you for taking time to visit with me today. Please don't hesitate to contact me if I can be of assistance to you.   Following are the goals we discussed today:   Goals Addressed             This Visit's Progress    Reduce symptoms of depression and improve quality of life       Care Coordination Interventions: CSW followed up with pt who indicates she has been re-certified for Food Stamps and Medicaid.  Pt previously shared with CSW history of depression and "childhood trauma" and she is wanting to get counseling help. Pt reported "struggle to get out of bed" and a fear for safety that limits her comfort and ability to go out.  Depression screen reviewed  Solution-Focused Strategies employed:  Mindfulness or Relaxation training provided Active listening / Reflection utilized  Emotional Support Provided Reviewed mental health medications and discussed importance of compliance:   Participation in counseling encouraged : Pt reports seeing her previous counselor for virtual visits until she is able to hopefully connect with [employer] for grant-funded trauma support Participation in support group encouraged : [employer] has a support group and pt is awaiting info from them  [employer] / information provided  Reminded pt to call [phone] for mental health support  Suicidal Ideation/Homicidal Ideation assessed: denies          Our next appointment is by telephone on 02/09/22 at 930  Please call the care guide team at [phone] if you need to cancel or reschedule your appointment.   If you are experiencing a Mental Health or [place] or need someone to talk to, please call the [place] National Suicide Prevention Lifeline: (208)386-9810 or TTY: 984-141-1167 TTY (623)434-4504) to talk to a trained counselor call 911   The patient verbalized understanding of instructions, educational materials, and care plan provided  today and DECLINED offer to receive copy of patient instructions, educational materials, and care plan.   Telephone follow up appointment with care management team member scheduled for: 02/09/22  Eduard Clos MSW, LCSW Licensed Clinical Social Worker    [phone]

## 2022-01-31 NOTE — Patient Outreach (Signed)
  Care Coordination   Follow Up Visit Note   01/30/2022 Name: Roberta Rodriguez MRN: [id number] DOB: 05-07-1990  Roberta Rodriguez is a 32 y.o. year old female who sees Martyn Malay, MD for primary care. I spoke with  Roberta Rodriguez by phone today.  What matters to the patients health and wellness today?  Depression and need for support (mental health/financial)    Goals Addressed             This Visit's Progress    Reduce symptoms of depression and improve quality of life       Care Coordination Interventions: CSW followed up with pt who indicates she has been re-certified for Food Stamps and Medicaid.  Pt previously shared with CSW history of depression and "childhood trauma" and she is wanting to get counseling help. Pt reported "struggle to get out of bed" and a fear for safety that limits her comfort and ability to go out.  Depression screen reviewed  Solution-Focused Strategies employed:  Mindfulness or Relaxation training provided Active listening / Reflection utilized  Emotional Support Provided Reviewed mental health medications and discussed importance of compliance:   Participation in counseling encouraged : Pt reports seeing her previous counselor for virtual visits until she is able to hopefully connect with [employer] for grant-funded trauma support Participation in support group encouraged : [employer] has a support group and pt is awaiting info from them  [employer] / information provided  Reminded pt to call [phone] for mental health support  Suicidal Ideation/Homicidal Ideation assessed: denies          SDOH assessments and interventions completed:  Yes     Care Coordination Interventions:  Yes, provided   Follow up plan: Follow up call scheduled for 02/09/22    Encounter Outcome:  Pt. Visit Completed

## 2022-01-31 NOTE — Patient Outreach (Deleted)
{  CARE COORDINATION NOTES:27886} 

## 2022-02-02 ENCOUNTER — Ambulatory Visit: Payer: Commercial Managed Care - HMO | Admitting: Family Medicine

## 2022-02-02 ENCOUNTER — Telehealth: Payer: Self-pay | Admitting: Family Medicine

## 2022-02-02 NOTE — Telephone Encounter (Signed)
Attempted to call patient about missed visit. VM full.  Dorris Singh, MD  Family Medicine Teaching Service

## 2022-02-09 ENCOUNTER — Ambulatory Visit: Payer: Self-pay | Admitting: *Deleted

## 2022-02-09 NOTE — Patient Outreach (Signed)
  Care Coordination   Follow Up Visit Note   02/09/2022 Name: Roberta Rodriguez MRN: [id number] DOB: 1990-07-19  Roberta Rodriguez is a 31 y.o. year old female who sees Roberta Malay, MD for primary care. I spoke with  Roberta Rodriguez by phone today.  What matters to the patients health and wellness today?  Health and mental health support.    Goals Addressed             This Visit's Progress    Reduce symptoms of depression and improve quality of life       Care Coordination Interventions: CSW followed up with pt who reports finished intake paperwork for [facility] and is continuing her virtual visits with Ms Roberta Rodriguez.  Pt has been re-certified for Food Stamps and Medicaid.  Pt previously shared with CSW history of depression and "childhood trauma" and she is wanting to get counseling help. Pt reported "struggle to get out of bed" and a fear for safety that limits her comfort and ability to go out.  Pt continues to seek employment- suggested she outreach [facility] and search on [employer].  Pt excited to see her nephew who will have his first Christmas this year. Depression screen reviewed  Solution-Focused Strategies employed:  Mindfulness or Relaxation training provided Active listening / Reflection utilized  Emotional Support Provided Reviewed mental health medications and discussed importance of compliance:   Participation in counseling encouraged : Pt reports seeing her previous counselor for virtual visits until she is able to hopefully connect with [employer] for grant-funded trauma support Participation in support group encouraged : [employer] has a support group and pt is awaiting info from them  [employer] / information provided  Reminded pt to call [phone] for mental health support  Suicidal Ideation/Homicidal Ideation assessed: denies          SDOH assessments and interventions completed:  Yes     Care Coordination  Interventions:  Yes, provided   Follow up plan: Follow up call scheduled for 03/09/22    Encounter Outcome:  Pt. Visit Completed

## 2022-02-09 NOTE — Patient Instructions (Signed)
Visit Information  Thank you for taking time to visit with me today. Please don't hesitate to contact me if I can be of assistance to you before our next scheduled telephone appointment.  Following are the goals we discussed today:  (Copy and paste patient goals from clinical care plan here)  Our next appointment is by telephone on 03/09/22  Please call the care guide team at [phone] if you need to cancel or reschedule your appointment.   If you are experiencing a Mental Health or [place] or need someone to talk to, please call the Suicide and Crisis Lifeline: 988 call 911   Patient verbalizes understanding of instructions and care plan provided today and agrees to view in [place]. Active MyChart status and patient understanding of how to access instructions and care plan via MyChart confirmed with patient.    Eduard Clos MSW, LCSW Licensed Clinical Social Worker    [phone]

## 2022-03-09 ENCOUNTER — Telehealth: Payer: Self-pay | Admitting: *Deleted

## 2022-03-09 ENCOUNTER — Encounter: Payer: Self-pay | Admitting: *Deleted

## 2022-03-09 NOTE — Telephone Encounter (Signed)
Roberta Rodriguez,   Patient was a no show for a follow up with Gildardo Pounds on 03/09/22 she is a Manage Medicaid patient.  Will you please outreach and reschedule with Jerene Pitch if patient still would like to continue services. Marcie Bal can you give report to [place].  [place]  Direct Dial: [phone]

## 2022-03-09 NOTE — Patient Outreach (Signed)
  Care Coordination   03/09/2022 Name: Roberta Rodriguez MRN: [id number] DOB: 10-Jun-1990   Care Coordination Outreach Attempts:  An unsuccessful telephone outreach was attempted today to offer the patient information about available care coordination services as a benefit of their health plan.   Follow Up Plan:  Additional outreach attempts will be made to offer the patient care coordination information and services.   Encounter Outcome:  No Answer   Care Coordination Interventions:  No, not indicated    Eduard Clos MSW, LCSW Licensed Clinical Social Worker    [phone]

## 2022-03-19 ENCOUNTER — Other Ambulatory Visit: Payer: Medicaid Other | Admitting: Licensed Clinical Social Worker

## 2022-03-19 NOTE — Patient Outreach (Signed)
  Medicaid Managed Care   Unsuccessful Attempt Note   03/19/2022 Name: Roberta Rodriguez MRN: [id number] DOB: Jul 19, 1990  Referred by: Martyn Malay, MD Reason for referral : No chief complaint on file.   An unsuccessful telephone outreach was attempted today. The patient was referred to the case management team for assistance with care management and care coordination.    Follow Up Plan: A HIPAA compliant phone message was left for the patient providing contact information and requesting a return call.   Eula Fried, BSW, MSW, [employer] Managed Medicaid [email or web address] [email or web address] Phone: [phone]

## 2022-03-20 ENCOUNTER — Telehealth: Payer: Self-pay

## 2022-03-20 NOTE — Telephone Encounter (Signed)
..  Medicaid Managed Care   Unsuccessful Outreach Note  03/20/2022 Name: Roberta Rodriguez MRN: [id number] DOB: 1990/08/16  Referred by: Martyn Malay, MD Reason for referral : Appointment (I called to reschedule her missed phone appt with the MM LCSW.)   A second unsuccessful telephone outreach was attempted today. The patient was referred to the case management team for assistance with care management and care coordination.   Follow Up Plan: The care management team will reach out to the patient again over the next 7 days.     [place]

## 2022-03-21 ENCOUNTER — Telehealth: Payer: Self-pay | Admitting: *Deleted

## 2022-03-21 NOTE — Patient Outreach (Signed)
This CSW is signing off. Pt is now eligible for Managed Medicaid team support. CSW has contacted CSW with the Managed Medicaid team, Eula Fried, and will provide briefing/updates to her as needed.  Eduard Clos, MSW, [place] Worker [employer] [phone]

## 2022-03-27 ENCOUNTER — Telehealth: Payer: Self-pay

## 2022-03-27 IMAGING — US US ABDOMEN LIMITED
1 series · 14 of 25 positions shown · non-contrast
Comparison: None.

CLINICAL DATA: Intermittent vomiting with meals

Right upper quadrant pain for 1 year
EXAM:
ULTRASOUND ABDOMEN LIMITED RIGHT UPPER QUADRANT

[Series 1: us abdomen limited ruq (liver/gb) · 14 of 54 slices shown]
[im 1/54]
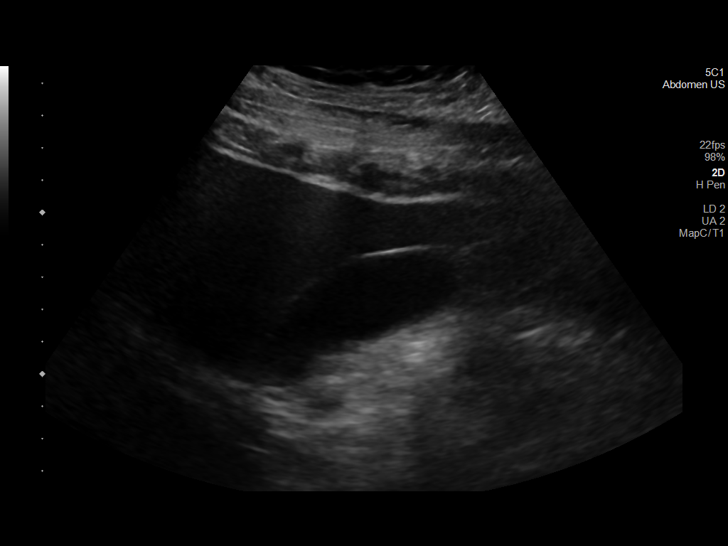
[im 5/54]
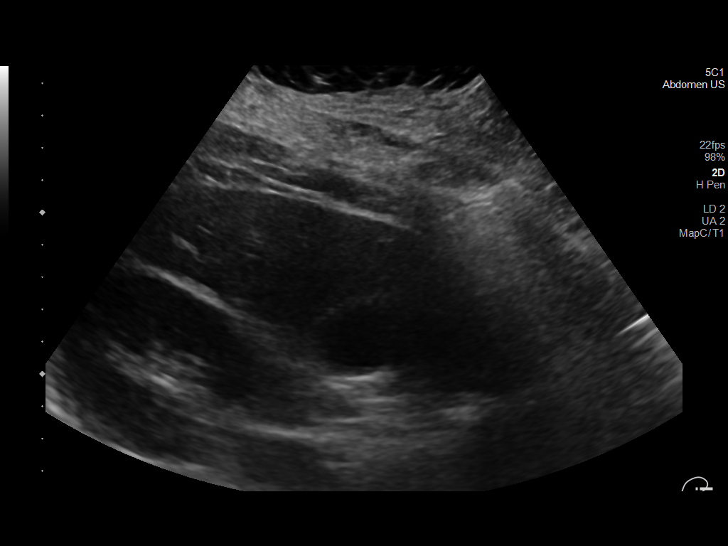
[im 9/54]
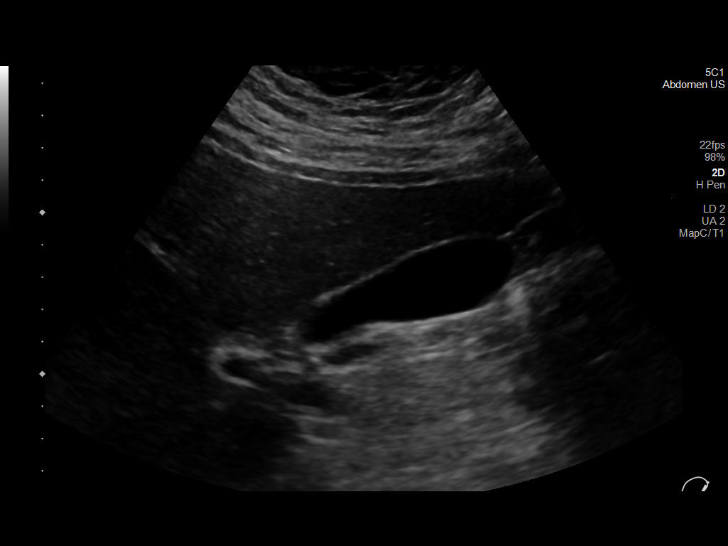
[im 14/54]
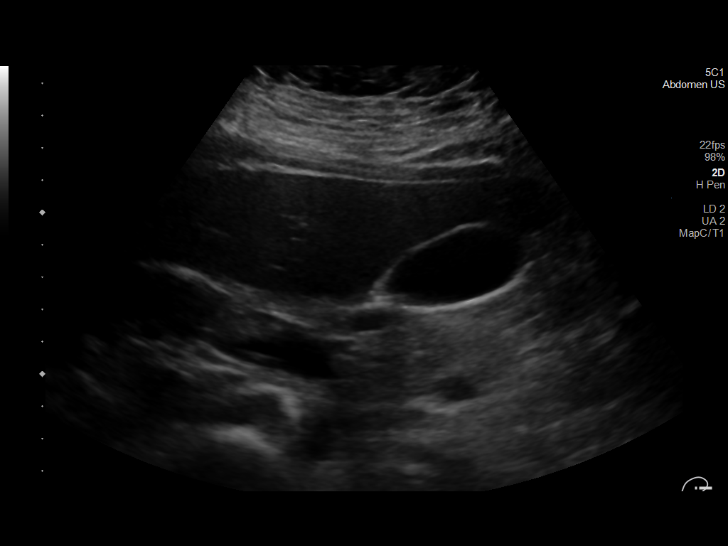
[im 18/54]
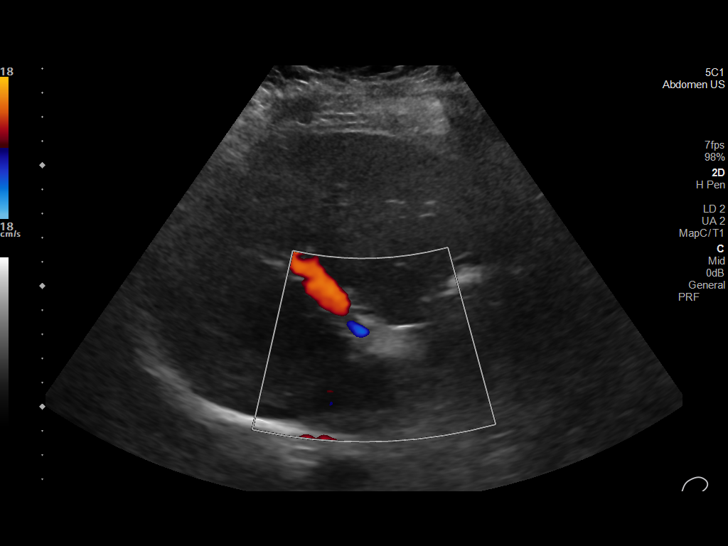
[im 20/54]
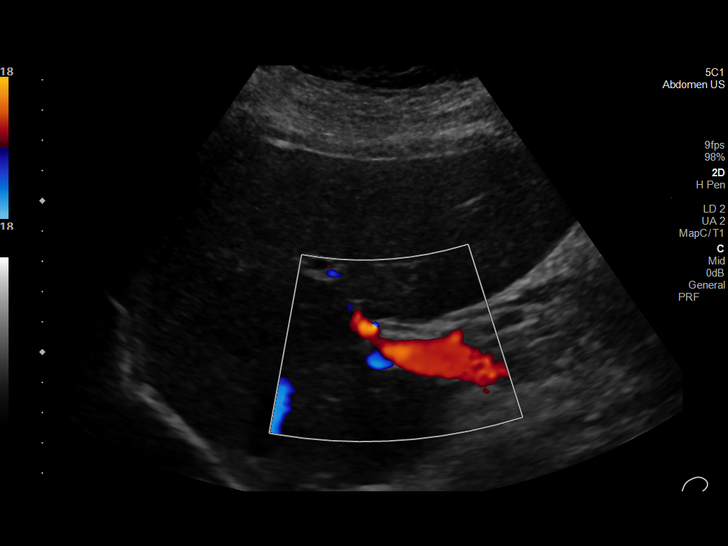
[im 25/54]
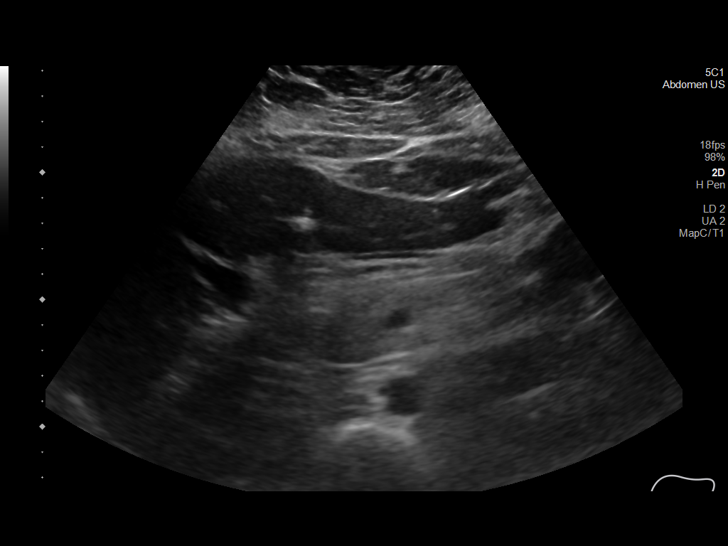
[im 29/54]
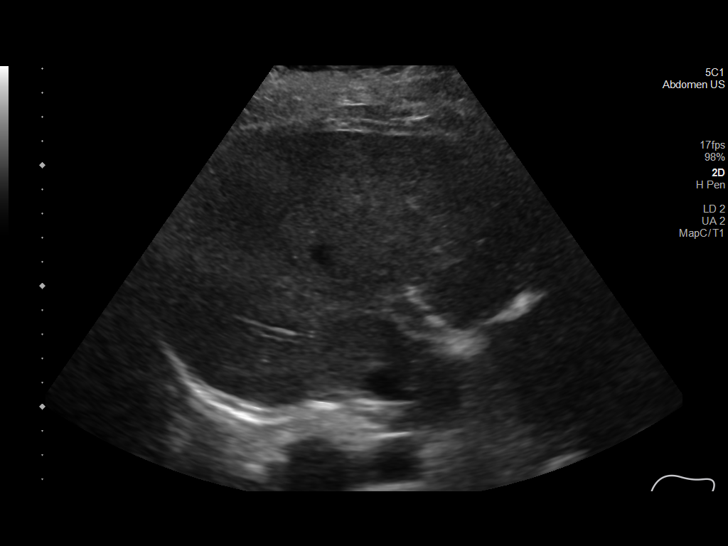
[im 34/54]
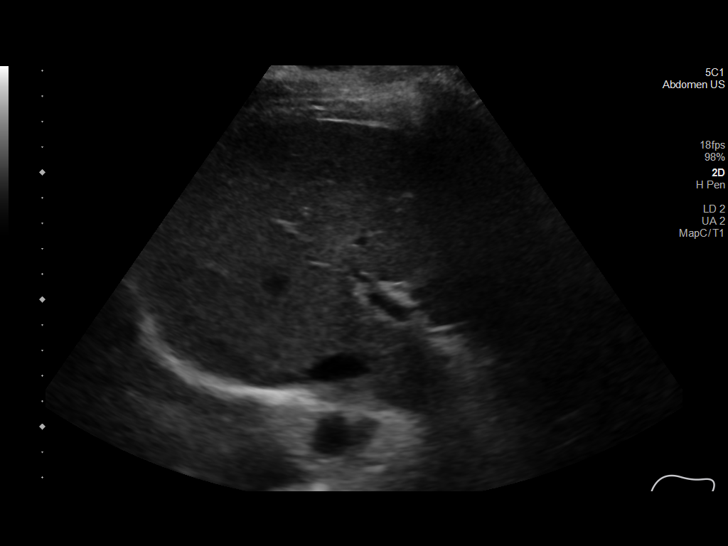
[im 36/54]
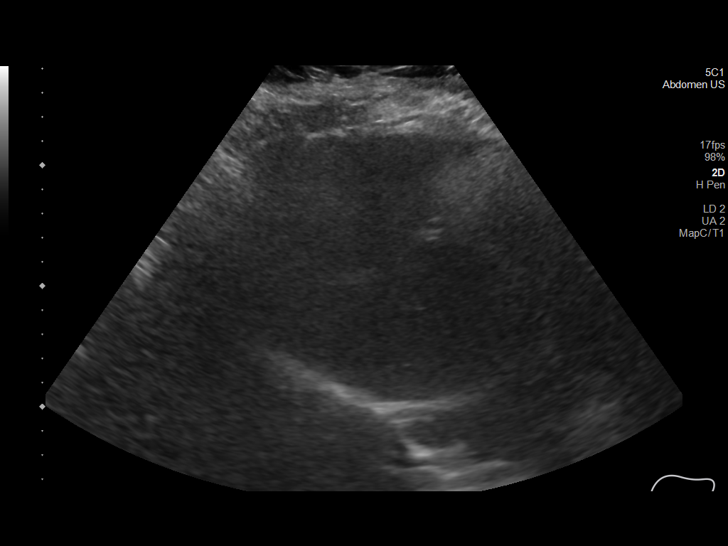
[im 40/54]
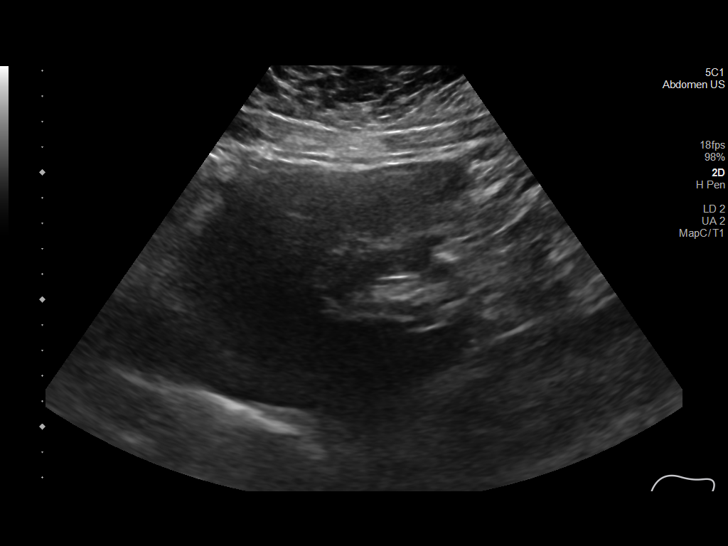
[im 45/54]
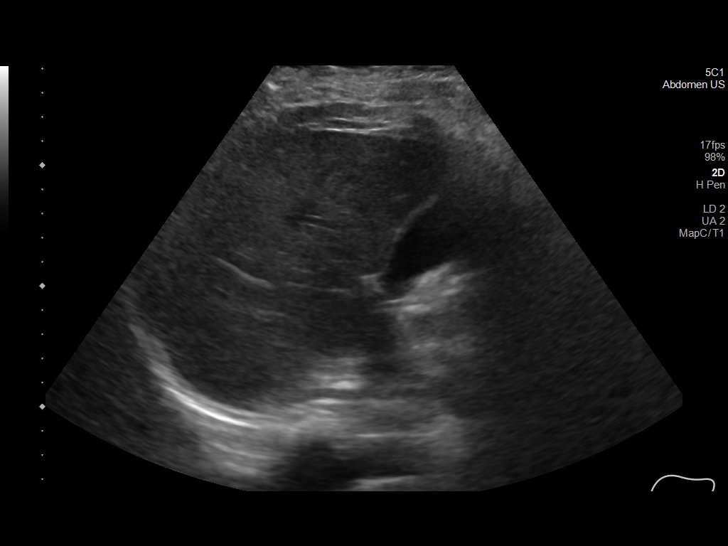
[im 49/54]
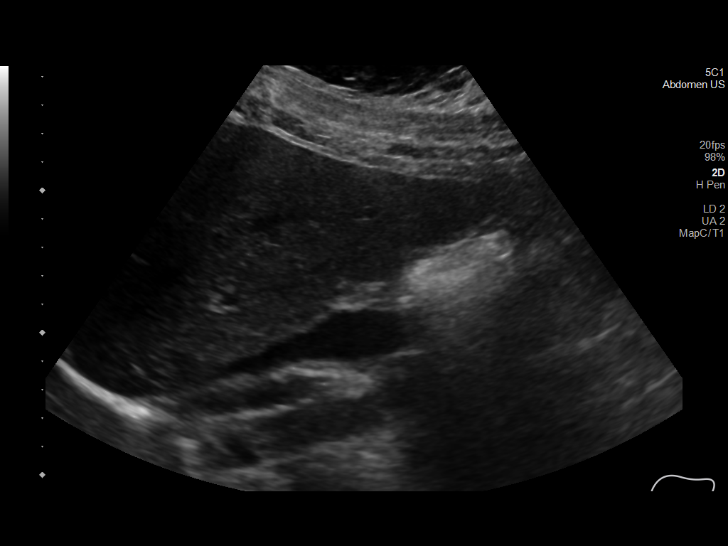
[im 54/54]
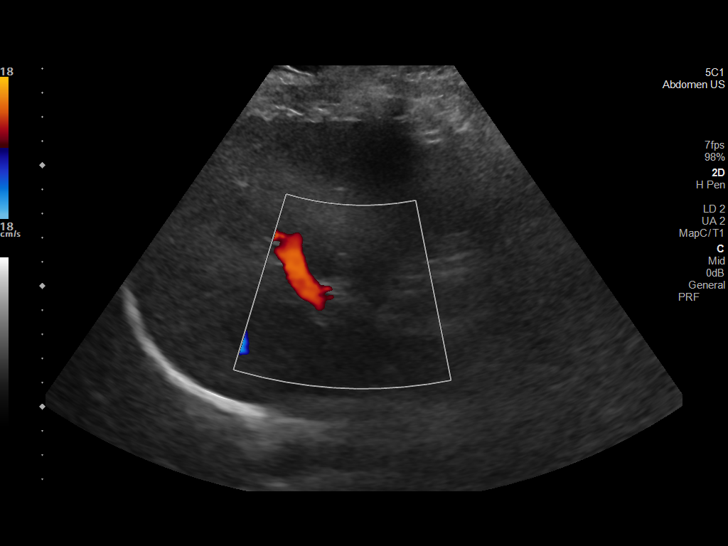

[14 of 25 positions shown; findings below may reference images not displayed]

FINDINGS: Gallbladder:

No gallstones or wall thickening visualized. No sonographic Murphy
sign noted by sonographer.

Common bile duct:

Diameter: 3 mm

Liver:

No focal lesion identified. Within normal limits in parenchymal
echogenicity. Portal vein is patent on color Doppler imaging with
normal direction of blood flow towards the liver.

Other: None.
IMPRESSION: No significant sonographic abnormality of the liver or gallbladder.

## 2022-03-27 NOTE — Telephone Encounter (Signed)
..  Medicaid Managed Care   Unsuccessful Outreach Note  03/27/2022 Name: Roberta Rodriguez MRN: [id number] DOB: [birth year]  Referred by: Martyn Malay, MD Reason for referral : Appointment (I called to reschedule her missed appointment with the MM LCSW. I left my name and number on her VM.)   Third unsuccessful telephone outreach was attempted today. The patient was referred to the case management team for assistance with care management and care coordination. The patient's primary care provider has been notified of our unsuccessful attempts to make or maintain contact with the patient. The care management team is pleased to engage with this patient at any time in the future should he/she be interested in assistance from the care management team.   Follow Up Plan: We have been unable to make contact with the patient for follow up. The care management team is available to follow up with the patient after provider conversation with the patient regarding recommendation for care management engagement and subsequent re-referral to the care management team.   [place], [place]

## 2022-03-28 ENCOUNTER — Other Ambulatory Visit: Payer: Medicaid Other | Admitting: Licensed Clinical Social Worker

## 2022-03-28 NOTE — Patient Outreach (Signed)
  Medicaid Managed Care   Unsuccessful Attempt Note   03/28/2022 Name: [name] MRN: [id number] DOB: Jan 26, 1991  Referred by: Martyn Malay, MD Reason for referral : No chief complaint on file.   Third unsuccessful telephone outreach was attempted today. The patient was referred to the case management team for assistance with care management and care coordination. The patient's primary care provider has been notified of our unsuccessful attempts to make or maintain contact with the patient. The care management team is pleased to engage with this patient at any time in the future should he/she be interested in assistance from the care management team.    Follow Up Plan: The Managed Medicaid care management team is available to follow up with the patient after provider conversation with the patient regarding recommendation for care management engagement and subsequent re-referral to the care management team.    Eula Fried, BSW, MSW, LCSW Managed Medicaid [email or web address] [email or web address] Phone: [phone]

## 2022-04-03 ENCOUNTER — Telehealth: Payer: Self-pay

## 2022-04-03 NOTE — Telephone Encounter (Signed)
..   Medicaid Managed Care   Unsuccessful Outreach Note  04/03/2022 Name: Roberta Rodriguez MRN: [id number] DOB: 27-Mar-1990  Referred by: Martyn Malay, MD Reason for referral : Appointment (I called to reschedule her missed phone appointment with the MM LCSW. She did not answer and her VM was full.)   Third unsuccessful telephone outreach was attempted today. The patient was referred to the case management team for assistance with care management and care coordination. The patient's primary care provider has been notified of our unsuccessful attempts to make or maintain contact with the patient. The care management team is pleased to engage with this patient at any time in the future should he/she be interested in assistance from the care management team.   Follow Up Plan: We have been unable to make contact with the patient for follow up. The care management team is available to follow up with the patient after provider conversation with the patient regarding recommendation for care management engagement and subsequent re-referral to the care management team.   [place], [place]

## 2022-04-09 ENCOUNTER — Ambulatory Visit (INDEPENDENT_AMBULATORY_CARE_PROVIDER_SITE_OTHER): Payer: Medicaid Other | Admitting: Family Medicine

## 2022-04-09 VITALS — BP 165/86 | HR 97 | Ht 66.0 in | Wt 348.5 lb

## 2022-04-09 DIAGNOSIS — N939 Abnormal uterine and vaginal bleeding, unspecified: Secondary | ICD-10-CM | POA: Insufficient documentation

## 2022-04-09 DIAGNOSIS — N921 Excessive and frequent menstruation with irregular cycle: Secondary | ICD-10-CM

## 2022-04-09 DIAGNOSIS — N920 Excessive and frequent menstruation with regular cycle: Secondary | ICD-10-CM | POA: Insufficient documentation

## 2022-04-09 DIAGNOSIS — F39 Unspecified mood [affective] disorder: Secondary | ICD-10-CM

## 2022-04-09 NOTE — Assessment & Plan Note (Addendum)
-  likely secondary to initiation of newly started contraception, I explained to patient that these changes can be normal with depo -after extensive discussion and shared decision making, patient has decided to continue for a few months to see how her body adjusts  -instructed to follow up for nurse visit around 2/27 when she is due for her next depo injection -follow up with PCP in 2 months to potentially discuss switching to different form of contraception if she desires. Continue depo for now

## 2022-04-09 NOTE — Assessment & Plan Note (Signed)
-  diagnosed with bipolar depression, chronic and stable -reassurance provided, 988 resource provided -continue to maintain routine weekly follow up with therapist and monthly follow up with psychiatrist -continue current regimen -PHQ-9 score of 16 with 1 for question 9 reviewed and discussion.  -follow up as appropriate

## 2022-04-09 NOTE — Patient Instructions (Signed)
It was great seeing you today!  Today we discussed your menstrual cycles, it is normal for cycles to fluctuate after starting depo. Continue to monitor your cycles and follow up in 2 months. At that time we can discuss changing birth control if you would like. Please follow up around 2/27 to get your next depo injection.   We also discussed your mood, I am very proud of all the work that you have done and it shows. If you have thoughts of harming yourself, please call [phone]. Please continue to follow up with [place], the therapist and psychiatrist.   Please follow up at your next scheduled appointment in 2 months, if anything arises between now and then, please don't hesitate to contact our office.   Thank you for allowing [place] to be a part of your medical care!  Thank you, Dr. Larae Grooms  Also a reminder of our clinic's no-show policy. Please make sure to arrive at least 15 minutes prior to your scheduled appointment time. Please try to cancel before 24 hours if you are not able to make it. If you no-show for 2 appointments then you will be receiving a warning letter. If you no-show after 3 visits, then you may be at risk of being dismissed from our clinic. This is to ensure that everyone is able to be seen in a timely manner. Thank you, we appreciate your assistance with this!

## 2022-04-09 NOTE — Progress Notes (Signed)
    SUBJECTIVE:   CHIEF COMPLAINT / HPI:   Patient presents for concern of irregular menstrual cycle. She recently decided to start on birth control, received her first depo injection on 01/22/2022. LMP 03/09/2022, beginning of the week it tends to be heavy and then flow reduces. Sometimes a few days later she gets another period sooner than a month. Prior to starting birth control, she had her cycle every month only lasting about 3-5 days. Aside from her being irregular when she first started her cycles for the first few years.   Recently diagnosed with bipolar depression last year. She sees psychiatry regularly. In 2011, she attempted suicide by overdosing on tylenol. She admits that she was unsuccessful and knew that she did not want to harm herself but was having a lot of personal issues going on and pressure from school. At the time, she shares that she just wanted to hurt herself but not harm herself. She admits that she would never want to do that again as it made herself feel bad and does not want to to put herself through that again or her family. She has attempted since then, that was her only attempt. Denies having thoughts of harming herself currently, the last time was the beginning of last year. Denies any plan. Support system includes her therapist and psychiatrist, also her best friend.   OBJECTIVE:   BP (!) 165/86   Pulse 97   Ht '5\' 6"'$  (1.676 m)   Wt (!) 348 lb 8 oz (158.1 kg)   LMP 03/09/2022   SpO2 100%   BMI 56.25 kg/m   General: Patient well-appearing, in no acute distress. Resp: normal work of breathing noted Psych: mood appropriate, denies SI or plan, tearful at times   ASSESSMENT/PLAN:   Menorrhagia -likely secondary to initiation of newly started contraception, I explained to patient that these changes can be normal with depo -after extensive discussion and shared decision making, patient has decided to continue for a few months to see how her body adjusts   -instructed to follow up for nurse visit around 2/27 when she is due for her next depo injection -follow up with PCP in 2 months to potentially discuss switching to different form of contraception if she desires. Continue depo for now  Mood disorder -diagnosed with bipolar depression, chronic and stable -reassurance provided, 988 resource provided -continue to maintain routine weekly follow up with therapist and monthly follow up with psychiatrist -continue current regimen -PHQ-9 score of 16 with 1 for question 9 reviewed and discussion.  -follow up as appropriate        Roberta Rodriguez, [place]

## 2022-05-18 ENCOUNTER — Other Ambulatory Visit: Payer: Self-pay

## 2022-05-18 ENCOUNTER — Encounter: Payer: Self-pay | Admitting: Family Medicine

## 2022-05-18 ENCOUNTER — Ambulatory Visit (INDEPENDENT_AMBULATORY_CARE_PROVIDER_SITE_OTHER): Payer: Medicaid Other | Admitting: Family Medicine

## 2022-05-18 DIAGNOSIS — G8929 Other chronic pain: Secondary | ICD-10-CM

## 2022-05-18 DIAGNOSIS — N939 Abnormal uterine and vaginal bleeding, unspecified: Secondary | ICD-10-CM | POA: Diagnosis not present

## 2022-05-18 DIAGNOSIS — M25512 Pain in left shoulder: Secondary | ICD-10-CM | POA: Diagnosis not present

## 2022-05-18 DIAGNOSIS — F39 Unspecified mood [affective] disorder: Secondary | ICD-10-CM | POA: Diagnosis not present

## 2022-05-18 MED ORDER — LIDOCAINE 5 % EX PTCH: 1.0000 | MEDICATED_PATCH | CUTANEOUS | 0 refills | Status: DC

## 2022-05-18 NOTE — Progress Notes (Addendum)
SUBJECTIVE:   CHIEF COMPLAINT / HPI:   Roberta Rodriguez is a 32 y.o. female who presents today for depressed mood, AUB, L shoulder pain.  Mood Disorder Reports her mood has been continually depressed since start of 2024. Has had depression since her teenage years (with suicide attempt in late high school by pills); was under good control in her 37s, but recently worsened. Has passive thoughts of not wanting to live but no plan to harm herself or end her life. Protective factors include her dog and her 1yo nephew. Followed by psych for bipolar depression, though reports she feels like they have just been cycling her through medications without doing any real change or actually talking to her. Has tried lamotrigine (didn't work, so pt discontinued), Zoloft (long time ago in late high school, took her off because was too sleepy), an additional medication (can't remember). Also sees a therapist, though has not seen in a couple weeks due to feeling like she didn't want to keep reliving/talking about the same things.   Abnormal Uterine Bleeding Continually bleeding since 03/09/2022. Soaking through pads, not just spotting (though lightened up last night). Not interested in birth control at this time.Denies syncope, CP or dizziness.   Left Shoulder Pain Started mid-January, happening more frequently. Random shocks of pain, sometimes "pulling at socket." No radiation except for back of shoulder. Randomly, more frequently. Side and back sleeper. Denies weakness, numbness or neck pain.   PERTINENT  PMH / PSH: Mild persistent asthma, bipolar depression  OBJECTIVE:   BP 132/85   Pulse 97   Ht 5\' 6"  (1.676 m)   Wt (!) 345 lb 12.8 oz (156.9 kg)   LMP 03/09/2022   SpO2 100%   BMI 55.81 kg/m   General: Patient is well-appearing.  Cardiovascular: RRR. Normal S1, S2, no murmurs, rubs, clicks or gallops. Pulmonary: Normal effort of breathing. Lungs clear to auscultation bilaterally. MSK: Full  range of motion in right shoulder. Limited ability to move left hand behind back; otherwise full range of motion in left shoulder. Normal strength in bilateral shoulders. Tenderness to palpation of left posterior shoulder. Pain with Hawkins  test of left shoulder. Neuro/Psych: A&Ox3. Tearful affect intermittently.  ASSESSMENT/PLAN:   Mood disorder Reports significantly depressed mood since start of 2024; no longer on medication, intermittently seeing therapist and psychiatrist. PHQ question 9 positive; SI, no plan to end life or harm self. - Encouraged to follow-up with therapist and see if she feels somewhat better after this appointment; can bring up CBT with therapist - Resources provided for alternative psychiatry practices - Will have close follow up in a week.  Abnormal uterine bleeding (AUB) Has had continual bleeding since mid-January 2024 after Depo shot in late 11/20203. Given prolonged course of heavy bleeding, do not suspect this is related to Depo shot. - Deferred pelvic exam today given pt's prominent mood concerns; can consider at follow-up appointment - Will check labs, including CBC and ferritin, to evaluate blood levels   Left Shoulder Pain Given presentation and physical exam findings, suspect rotator cuff injury, likely supraspinatus. Low concern for cervical spine diseae  - Lidocaine patches provided for pain relief - Can consider referral to physical therapy if patches ineffective  [name], [place]   Patient seen along with MS3 student [name]. I personally evaluated this patient along with the student, and verified all aspects of the history, physical exam, and medical decision making as documented by the  student. I agree with the student's documentation and have made all necessary edits.

## 2022-05-18 NOTE — Patient Instructions (Signed)
It was wonderful to see you today.  Please bring ALL of your medications with you to every visit.   Today we talked about:  Please call [facility]  [place] [phone]  [facility] [phone]  For your shoulder I sent in Lidocaine patches  We will check your blood work  Please return in 1 week for a pelvic exam    Please follow up in 1 week   Thank you for choosing [facility].   Please call [phone] with any questions about today's appointment.  Please be sure to schedule follow up at the front  desk before you leave today.   Dorris Singh, MD  Family Medicine

## 2022-05-18 NOTE — Assessment & Plan Note (Signed)
Reports significantly depressed mood since start of 2024; no longer on medication, intermittently seeing therapist and psychiatrist. PHQ question 9 positive; SI, no plan to end life or harm self. - Encouraged to follow-up with therapist and see if she feels somewhat better after this appointment; can bring up CBT with therapist - Resources provided for alternative psychiatry practices - Will have close follow up in a week.

## 2022-05-18 NOTE — Assessment & Plan Note (Addendum)
Has had continual bleeding since mid-January 2024 after Depo shot in late 11/20203. Given prolonged course of heavy bleeding, do not suspect this is related to Depo shot. - Deferred pelvic exam today given pt's prominent mood concerns; can consider at follow-up appointment - Will check labs, including CBC and ferritin, to evaluate blood levels

## 2022-05-19 LAB — CBC
Hematocrit: 39.3 % (ref 34.0–46.6)
Hemoglobin: 12.4 g/dL (ref 11.1–15.9)
MCH: 27.3 pg (ref 26.6–33.0)
MCHC: 31.6 g/dL (ref 31.5–35.7)
MCV: 86 fL (ref 79–97)
Platelets: 325 10*3/uL (ref 150–450)
RBC: 4.55 x10E6/uL (ref 3.77–5.28)
RDW: 14.6 % (ref 11.7–15.4)
WBC: 5.4 10*3/uL (ref 3.4–10.8)

## 2022-05-19 LAB — LIPID PANEL
Chol/HDL Ratio: 4.4 ratio (ref 0.0–4.4)
Cholesterol, Total: 182 mg/dL (ref 100–199)
HDL: 41 mg/dL (ref >39)
LDL Chol Calc (NIH): 118 mg/dL — ABNORMAL HIGH (ref 0–99)
Triglycerides: 130 mg/dL (ref 0–149)
VLDL Cholesterol Cal: 23 mg/dL (ref 5–40)

## 2022-05-19 LAB — TSH RFX ON ABNORMAL TO FREE T4: TSH: 1.72 u[IU]/mL (ref 0.450–4.500)

## 2022-05-19 LAB — HEMOGLOBIN A1C
Est. average glucose Bld gHb Est-mCnc: 123 mg/dL
Hgb A1c MFr Bld: 5.9 % — ABNORMAL HIGH (ref 4.8–5.6)

## 2022-05-19 LAB — FERRITIN: Ferritin: 48 ng/mL (ref 15–150)

## 2022-05-24 NOTE — Progress Notes (Signed)
    SUBJECTIVE:   CHIEF COMPLAINT: mood follow up and pelvic HPI:   Roberta Rodriguez is a 32 y.o.  with history notable for mood disorder  presenting for follow up.  HS Has a history of HS. Uses clindamycin gel, Hibiclens without success. She reports her HS has worsened to scarring over the years. No drainage, fevers, or chills today. Localized to her underarms.   AUB The patient has a history of AUB. Recent CBC shows no anemia. Started menses last week. This can last more than 7 days, often heavy, soaking pads in just a few hours. No severe pain. No dizziness or syncope. Has a family history of fibroids. She is not sexually active currently.   Mood  The patient has a history of mood disorder previously on Lamictal. She reached out [employer]. She is interested in seeing psychiatry again. Reports primary symptoms are related to her anxiety. She is having severe agorophobia. Reports constant passive SI. Has no plan to hurt herself. Finding it harder and harder to leave house, now orders groceries. Has little family support for mental health. She did recently quit her job but thinks she will start searching for a job again soon.     Incontinence The patient has along standing history of nocturia. Has had prior urodynamics showed her bladder has very little capacity. Recently has had worsening leakage with coughing and sneezing. No new back pain or leg pain. She uses incontinence supplies for this. These allow her to function and work including complete activities of daily living and also shop, work, and attend community activities.    PERTINENT  PMH / PSH/Family/Social History :  Obesity Bipolar   OBJECTIVE:   BP (!) 140/80   Pulse 91   Ht 5\' 6"  (1.676 m)   Wt (!) 352 lb (159.7 kg)   LMP 03/09/2022   SpO2 100%   BMI 56.81 kg/m   Today's weight:  Last Weight  Most recent update: 05/28/2022  8:40 AM    Weight  159.7 kg (352 lb)              Review of prior  weights: [employer]   05/28/22 0839  Weight: (!) 352 lb (159.7 kg)  RRR  Lungs clear Axilla examined Numerous healing scars with overlying hyperpigmentation and underlying induration No fluctuance or drainage   ASSESSMENT/PLAN:   Abnormal uterine bleeding (AUB) Declined Pelvic Follow up 2 weeks when off menses for pelvic and ultrasound   Mood disorder Supportive listening provided Referral to psychiatry Resources for 988 given   Nocturia With worsening stress incontinence Has had since childhood Finds benefit from incontinence supplies, allows her to conduct activities of daily living  Will send prescription for this   Hydradenitis suppurative, suspected Referral to Dermatology Rx doxycycline for 2 weeks Continue excellent hygiene  Elevated BP Had prior 24 hour monitor demonstrating NO hypertension   HCM UTD     Dorris Singh, MD  [place]

## 2022-05-25 ENCOUNTER — Telehealth: Payer: Self-pay

## 2022-05-25 NOTE — Telephone Encounter (Signed)
A Prior Authorization was initiated for this patients LIDOCAINE 5% PATCHES through CoverMyMeds.   Key: [id number]

## 2022-05-28 ENCOUNTER — Encounter: Payer: Self-pay | Admitting: Family Medicine

## 2022-05-28 ENCOUNTER — Ambulatory Visit (INDEPENDENT_AMBULATORY_CARE_PROVIDER_SITE_OTHER): Payer: Medicaid Other | Admitting: Family Medicine

## 2022-05-28 ENCOUNTER — Other Ambulatory Visit: Payer: Self-pay

## 2022-05-28 VITALS — BP 140/80 | HR 91 | Ht 66.0 in | Wt 352.0 lb

## 2022-05-28 DIAGNOSIS — F39 Unspecified mood [affective] disorder: Secondary | ICD-10-CM | POA: Diagnosis not present

## 2022-05-28 DIAGNOSIS — L732 Hidradenitis suppurativa: Secondary | ICD-10-CM

## 2022-05-28 DIAGNOSIS — R351 Nocturia: Secondary | ICD-10-CM

## 2022-05-28 DIAGNOSIS — N939 Abnormal uterine and vaginal bleeding, unspecified: Secondary | ICD-10-CM

## 2022-05-28 MED ORDER — DOXYCYCLINE HYCLATE 100 MG PO TABS: 100.0000 mg | ORAL_TABLET | Freq: Two times a day (BID) | ORAL | 0 refills | Status: DC

## 2022-05-28 NOTE — Patient Instructions (Signed)
It was wonderful to see you today.  Please bring ALL of your medications with you to every visit.   Today we talked about:  - Follow up in about 2 weeks for your pelvic  Your labs overall look quite good--you do have prediabetes  I sent in a prescription to take twice a day for your underarms  This can make you VERY sensitive to sunshine please wear sunscreen  I have referred you to Dermatology to further evaluate your concern. If you do not received a phone call about this appointment within 2 weeks, please call our office back at [phone]. Jazmin Hartsell coordinates our referrals and can assist you in this.    You should also be called by Psychiatry for medications  Please follow up in 2 weeks  If you are feeling suicidal or depression symptoms worsen please immediately go to:   If you are thinking about harming yourself or having thoughts of suicide, or if you know someone who is, seek help right away. If you are in crisis, make sure you are not left alone.  If someone else is in crisis, make sure he/she/they is not left alone  Call 988 OR 1-800-273-TALK  24 Hour Availability for Katie  [address] [place], [place] [facility] [phone]    Other crisis resources:  [facility] [employer] (Domestic Violence, Rape & Victim Assistance [phone]  [facility]    (ONLY from 8am-4pm)    [phone]  [facility] (24/7)   [phone]  [place] National Suicide Hotline   [phone] Diamantina Monks)    Thank you for choosing [facility].   Please call [phone] with any questions about today's appointment.  Please be sure to schedule follow up at the front  desk before you leave today.   Dorris Singh, MD  Family Medicine

## 2022-05-28 NOTE — Assessment & Plan Note (Signed)
Declined Pelvic Follow up 2 weeks when off menses for pelvic and ultrasound

## 2022-05-28 NOTE — Assessment & Plan Note (Signed)
Supportive listening provided Referral to psychiatry Resources for 988 given

## 2022-05-28 NOTE — Assessment & Plan Note (Signed)
With worsening stress incontinence Has had since childhood Finds benefit from incontinence supplies, allows her to conduct activities of daily living  Will send prescription for this

## 2022-05-31 ENCOUNTER — Encounter: Payer: Self-pay | Admitting: Family Medicine

## 2022-05-31 NOTE — Telephone Encounter (Signed)
Prior Auth for patients medication LIDOCAINE 5% PATCHES denied by [facility] via CoverMyMeds.   Reason: Criteria for medical necessity were not met. Your records with [place] and the information submitted by your doctor do not show you meet the following criteria: (1) documentation supporting a condition of pain associated with post-herpetic neuralgia (nerve pain from shingles), nerve pain (such as pain associated with diabetic neuropathy [diabetic nerve pain]), or pain associated with radiation treatment or chemotherapy Please talk to your doctor about the treatment for pain in left shoulder.  LIDOCAINE 4% PATCHES AVAILABLE OTC   CoverMyMeds Key: [id number]

## 2022-06-06 ENCOUNTER — Ambulatory Visit (INDEPENDENT_AMBULATORY_CARE_PROVIDER_SITE_OTHER): Payer: Medicaid Other

## 2022-06-06 DIAGNOSIS — Z111 Encounter for screening for respiratory tuberculosis: Secondary | ICD-10-CM

## 2022-06-06 NOTE — Progress Notes (Signed)
Patient is here for a PPD placement.  PPD placed in left forearm @ 9:15 am.  Patient will return 06/08/2022 to have PPD read. Veronda Prude, RN

## 2022-06-08 ENCOUNTER — Ambulatory Visit: Payer: Medicaid Other

## 2022-06-08 DIAGNOSIS — Z111 Encounter for screening for respiratory tuberculosis: Secondary | ICD-10-CM

## 2022-06-08 LAB — TB SKIN TEST
Induration: 0 mm
TB Skin Test: NEGATIVE

## 2022-06-08 NOTE — Progress Notes (Unsigned)
    SUBJECTIVE:   CHIEF COMPLAINT: AUB HPI:   Roberta Rodriguez is a 32 y.o.  with history notable for mood disorder, obesity, and elevated office but normal home BP readings presenting for AUB.  She reports her mood is doing a bit better. 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 Her thoughts of SI have 'toned down'. No plan or intent to hurt herself. Has not yet established or scheduled visit for counseling or psychiatry.   She reports her AUB is improved. She is not sexually active currently. She is on nothing for birth control. Menses are irregular, can last more than 1 week, can soak through several pads per day. She is not bleeding currently. Desires STI testing. Will consider birth control in future.   In terms of her BP, she monitors her BP at home with readings in the low 100s/80s (?). BP readings here are elevated. Has a strong family history of HTN. No chest pain, dyspnea, palpitations.   PERTINENT  PMH / PSH/Family/Social History : updated and reviewed  OBJECTIVE:   BP (!) 159/91   Pulse 97   Ht 5\' 6"  (1.676 m)   Wt (!) 349 lb 6.4 oz (158.5 kg)   LMP 03/09/2022   SpO2 100%   BMI 56.39 kg/m   Today's weight:  Last Weight  Most recent update: 06/11/2022 10:01 AM    Weight  158.5 kg (349 lb 6.4 oz)              Review of prior weights: Filed Weights   06/11/22 1001  Weight: (!) 349 lb 6.4 oz (158.5 kg)    RRR Lungs clear GU Exam:    External exam: Slightly enlarged labia minora  Vaginal exam notable for moderate discharge.  Cervix without discharge or obvious lesion.  Bimanual exam reveals normal sized uterus no masses appreciated, no pain with examination  Chaperoned examine, CMA D Blount .     ASSESSMENT/PLAN:   Abnormal uterine bleeding (AUB) No cause found on pelvic examination today  Ultrasound ordered TSH was normal as was A1C Consider PCOS, anovulation related to adiposity, fibroids  Discussed contraception including Plan B   Mood disorder Referral  placed for Psychiatry  Number given for  Medicaid Coordinator and Psychiatry    Elevated BP Reading Scheduled for 24 hour monitor No symptoms of end organ damage  HCM UTD on Pap      Terisa Starr, MD  Family Medicine Teaching Service  [facility]

## 2022-06-08 NOTE — Progress Notes (Signed)
PPD Reading Note PPD read and results entered in EpicCare. Result: 0 mm induration. Interpretation: Negative Allergic reaction: No  

## 2022-06-11 ENCOUNTER — Ambulatory Visit (INDEPENDENT_AMBULATORY_CARE_PROVIDER_SITE_OTHER): Payer: Medicaid Other | Admitting: Family Medicine

## 2022-06-11 ENCOUNTER — Other Ambulatory Visit (HOSPITAL_COMMUNITY)
Admission: RE | Admit: 2022-06-11 | Discharge: 2022-06-11 | Disposition: A | Discharge status: Home / Self Care | Payer: Medicaid Other | Source: Ambulatory Visit | Attending: Family Medicine | Admitting: Family Medicine

## 2022-06-11 ENCOUNTER — Other Ambulatory Visit: Payer: Self-pay

## 2022-06-11 ENCOUNTER — Encounter: Payer: Self-pay | Admitting: Family Medicine

## 2022-06-11 VITALS — BP 159/91 | HR 97 | Ht 66.0 in | Wt 349.4 lb

## 2022-06-11 DIAGNOSIS — Z113 Encounter for screening for infections with a predominantly sexual mode of transmission: Secondary | ICD-10-CM

## 2022-06-11 DIAGNOSIS — F39 Unspecified mood [affective] disorder: Secondary | ICD-10-CM | POA: Diagnosis not present

## 2022-06-11 DIAGNOSIS — N939 Abnormal uterine and vaginal bleeding, unspecified: Secondary | ICD-10-CM | POA: Insufficient documentation

## 2022-06-11 NOTE — Patient Instructions (Addendum)
It was wonderful to see you today.  Please bring ALL of your medications with you to every visit.   Today we talked about:  -Checking for infection  - I will message you with results   - I do not see a cause of your heavy menses--I recommend an ultrasound  -Please call to schedule with Psychiatry   [phone]  Please call Renato Shin coordinator   Please schedule a 24 hour blood monitor with Dr. Raymondo Band     Thank you for choosing [facility].   Please call [phone] with any questions about today's appointment.  Please be sure to schedule follow up at the front  desk before you leave today.   Terisa Starr, MD  Family Medicine

## 2022-06-11 NOTE — Assessment & Plan Note (Signed)
Referral placed for Psychiatry  Number given for  [facility] Coordinator and Psychiatry

## 2022-06-11 NOTE — Assessment & Plan Note (Signed)
No cause found on pelvic examination today  Ultrasound ordered TSH was normal as was A1C Consider PCOS, anovulation related to adiposity, fibroids  Discussed contraception including Plan B

## 2022-06-12 ENCOUNTER — Ambulatory Visit (HOSPITAL_COMMUNITY): Admission: RE | Admit: 2022-06-12 | Payer: Medicaid Other | Source: Ambulatory Visit

## 2022-06-12 LAB — CERVICOVAGINAL ANCILLARY ONLY
Bacterial Vaginitis (gardnerella): POSITIVE — AB
Candida Glabrata: NEGATIVE
Candida Vaginitis: NEGATIVE
Chlamydia: NEGATIVE
Comment: NEGATIVE
Comment: NEGATIVE
Comment: NEGATIVE
Comment: NEGATIVE
Comment: NEGATIVE
Comment: NORMAL
Neisseria Gonorrhea: NEGATIVE
Trichomonas: NEGATIVE

## 2022-06-12 LAB — HCV AB W REFLEX TO QUANT PCR: HCV Ab: NONREACTIVE

## 2022-06-13 LAB — RPR, QUANT+TP ABS (REFLEX): Rapid Plasma Reagin, Quant: 1:32 {titer} — ABNORMAL HIGH

## 2022-06-13 LAB — HEPATITIS B SURFACE ANTIGEN: Hepatitis B Surface Ag: NEGATIVE

## 2022-06-14 ENCOUNTER — Telehealth: Payer: Self-pay | Admitting: Family Medicine

## 2022-06-14 ENCOUNTER — Other Ambulatory Visit: Payer: Self-pay | Admitting: Family Medicine

## 2022-06-14 DIAGNOSIS — A515 Early syphilis, latent: Secondary | ICD-10-CM

## 2022-06-14 DIAGNOSIS — B9689 Other specified bacterial agents as the cause of diseases classified elsewhere: Secondary | ICD-10-CM

## 2022-06-14 DIAGNOSIS — L732 Hidradenitis suppurativa: Secondary | ICD-10-CM

## 2022-06-14 LAB — RPR, QUANT+TP ABS (REFLEX): T Pallidum Abs: REACTIVE — AB

## 2022-06-14 LAB — HIV ANTIBODY (ROUTINE TESTING W REFLEX)

## 2022-06-14 MED ORDER — PENICILLIN G BENZATHINE 1200000 UNIT/2ML IM SUSY
2.4000 10*6.[IU] | PREFILLED_SYRINGE | Freq: Once | INTRAMUSCULAR | Status: AC
Start: 2022-06-14 — End: 2022-06-15
  Administered 2022-06-15: 2.4 10*6.[IU] via INTRAMUSCULAR

## 2022-06-14 MED ORDER — METRONIDAZOLE 500 MG PO TABS
500.0000 mg | ORAL_TABLET | Freq: Two times a day (BID) | ORAL | 0 refills | Status: AC
Start: 2022-06-14 — End: 2022-06-21

## 2022-06-14 MED ORDER — DOXYCYCLINE HYCLATE 100 MG PO TABS: 100.0000 mg | ORAL_TABLET | Freq: Two times a day (BID) | ORAL | 0 refills | Status: DC

## 2022-06-14 NOTE — Telephone Encounter (Signed)
Called patient. Scheduled nurse visit for tomorrow morning.   Veronda Prude, RN

## 2022-06-14 NOTE — Telephone Encounter (Signed)
Called with results. Discussed at length. Treatment with PCN ordered. Needs repeat RPR in 6 months. Rx for Flagyl to pharmacy. Will need to discuss PREP.   Nursing- can you please call and schedule her syphilis treatment? Single dose PCN 2.4 million units.  Terisa Starr, MD  Family Medicine Teaching Service

## 2022-06-14 NOTE — Telephone Encounter (Signed)
Patient LVM on nurse line returning PCP phone call.

## 2022-06-14 NOTE — Telephone Encounter (Signed)
Attempted to call patient. Reached voicemail, left generic voicemail to call back.  If patient calls back please find out good times to call her---she needs treated for syphilis (she has not seen this result yet).  Terisa Starr, MD  Family Medicine Teaching Service

## 2022-06-14 NOTE — Addendum Note (Signed)
Addended by: Manson Passey, Donyea Beverlin on: 06/14/2022 10:33 AM   Modules accepted: Orders

## 2022-06-14 NOTE — Addendum Note (Signed)
Addended by: Manson Passey, Delta Deshmukh on: 06/14/2022 10:24 AM   Modules accepted: Orders

## 2022-06-15 ENCOUNTER — Ambulatory Visit (INDEPENDENT_AMBULATORY_CARE_PROVIDER_SITE_OTHER): Payer: Medicaid Other

## 2022-06-15 DIAGNOSIS — A515 Early syphilis, latent: Secondary | ICD-10-CM

## 2022-06-15 DIAGNOSIS — A539 Syphilis, unspecified: Secondary | ICD-10-CM

## 2022-06-15 NOTE — Progress Notes (Addendum)
Patient in nurse clinic today for STD treatment of Syphilis.    Patient advised to abstain from sex for 7-10 days after treatment of self and partner.    Penicillin 1.2 million units given in LUOQ and Penicillin 1.2 units given in RUOQ per Dr. Theora Gianotti orders. Patient observed 15 minutes in office. No reaction noted.   Patient to follow up in 2-3 months for re-screening.    Provided condoms and advised to use with all sexual activity. Patient verbalized understanding.   STD report form fax completed and faxed to [facility] Department at [phone] (STD department).

## 2022-06-16 LAB — HCV INTERPRETATION

## 2022-06-16 LAB — RPR: RPR Ser Ql: REACTIVE — AB

## 2022-06-26 ENCOUNTER — Ambulatory Visit: Payer: Medicaid Other | Admitting: Pharmacist

## 2022-06-27 ENCOUNTER — Ambulatory Visit: Payer: Medicaid Other | Admitting: Pharmacist

## 2022-06-27 ENCOUNTER — Telehealth: Payer: Self-pay | Admitting: Pharmacist

## 2022-06-27 NOTE — Telephone Encounter (Signed)
Attempted contact to patient in follow-up of missed appointment for amb BP monitoring.  Left HIPAA-compliant voice mail requesting patient reschedule.  Provided main telephone number: [phone]

## 2022-07-25 ENCOUNTER — Ambulatory Visit (HOSPITAL_COMMUNITY): Payer: Medicaid Other | Admitting: Physician Assistant

## 2022-07-25 ENCOUNTER — Encounter (HOSPITAL_COMMUNITY): Payer: Self-pay

## 2022-09-07 ENCOUNTER — Telehealth: Payer: 59 | Admitting: Family Medicine

## 2022-09-07 DIAGNOSIS — B3731 Acute candidiasis of vulva and vagina: Secondary | ICD-10-CM

## 2022-09-07 NOTE — Progress Notes (Signed)
E-Visit for Vaginal Symptoms  We are sorry that you are not feeling well. Here is how we plan to help! Based on what you shared with me it looks like you: May have a yeast vaginosis  Vaginosis is an inflammation of the vagina that can result in discharge, itching and pain. The cause is usually a change in the normal balance of vaginal bacteria or an infection. Vaginosis can also result from reduced estrogen levels after menopause.  The most common causes of vaginosis are:   Bacterial vaginosis which results from an overgrowth of one on several organisms that are normally present in your vagina.   Yeast infections which are caused by a naturally occurring fungus called candida.   Vaginal atrophy (atrophic vaginosis) which results from the thinning of the vagina from reduced estrogen levels after menopause.   Trichomoniasis which is caused by a parasite and is commonly transmitted by sexual intercourse.  Factors that increase your risk of developing vaginosis include: Medications, such as antibiotics and steroids Uncontrolled diabetes Use of hygiene products such as bubble bath, vaginal spray or vaginal deodorant Douching Wearing damp or tight-fitting clothing Using an intrauterine device (IUD) for birth control Hormonal changes, such as those associated with pregnancy, birth control pills or menopause Sexual activity Having a sexually transmitted infection  Your treatment plan is Monistat (miconazole) or Gyne-Lotrimin (clotrimazole) over the counter at most pharmacies.  Be sure to take all of the medication as directed. Stop taking any medication if you develop a rash, tongue swelling or shortness of breath. Mothers who are breast feeding should consider pumping and discarding their breast milk while on these antibiotics. However, there is no consensus that infant exposure at these doses would be harmful.  Remember that medication creams can weaken latex condoms. .   HOME  CARE:  Good hygiene may prevent some types of vaginosis from recurring and may relieve some symptoms:  Avoid baths, hot tubs and whirlpool spas. Rinse soap from your outer genital area after a shower, and dry the area well to prevent irritation. Don't use scented or harsh soaps, such as those with deodorant or antibacterial action. Avoid irritants. These include scented tampons and pads. Wipe from front to back after using the toilet. Doing so avoids spreading fecal bacteria to your vagina.  Other things that may help prevent vaginosis include:  Don't douche. Your vagina doesn't require cleansing other than normal bathing. Repetitive douching disrupts the normal organisms that reside in the vagina and can actually increase your risk of vaginal infection. Douching won't clear up a vaginal infection. Use a latex condom. Both female and female latex condoms may help you avoid infections spread by sexual contact. Wear cotton underwear. Also wear pantyhose with a cotton crotch. If you feel comfortable without it, skip wearing underwear to bed. Yeast thrives in moist environments Your symptoms should improve in the next day or two.  GET HELP RIGHT AWAY IF:  You have pain in your lower abdomen ( pelvic area or over your ovaries) You develop nausea or vomiting You develop a fever Your discharge changes or worsens You have persistent pain with intercourse You develop shortness of breath, a rapid pulse, or you faint.  These symptoms could be signs of problems or infections that need to be evaluated by a medical provider now.  MAKE SURE YOU   Understand these instructions. Will watch your condition. Will get help right away if you are not doing well or get worse.  Thank you for choosing   an e-visit.  Your e-visit answers were reviewed by a board certified advanced clinical practitioner to complete your personal care plan. Depending upon the condition, your plan could have included both over the  counter or prescription medications.  Please review your pharmacy choice. Make sure the pharmacy is open so you can pick up prescription now. If there is a problem, you may contact your provider through MyChart messaging and have the prescription routed to another pharmacy.  Your safety is important to us. If you have drug allergies check your prescription carefully.   For the next 24 hours you can use MyChart to ask questions about today's visit, request a non-urgent call back, or ask for a work or school excuse. You will get an email in the next two days asking about your experience. I hope that your e-visit has been valuable and will speed your recovery.    have provided 5 minutes of non face to face time during this encounter for chart review and documentation.   
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
## 2023-01-16 ENCOUNTER — Ambulatory Visit: Payer: 59 | Admitting: Family Medicine

## 2023-01-16 DIAGNOSIS — Z0000 Encounter for general adult medical examination without abnormal findings: Secondary | ICD-10-CM

## 2023-05-02 ENCOUNTER — Telehealth: Payer: Self-pay | Admitting: Family Medicine

## 2023-05-02 NOTE — Telephone Encounter (Signed)
 Hi Risk manager,  Please schedule this patient for follow up with me for incontinence supplies in the next 1-2 months.  Thanks, Terisa Starr, MD  [facility] Teaching Service

## 2023-10-14 ENCOUNTER — Encounter: Admitting: Family Medicine

## 2023-11-01 ENCOUNTER — Encounter: Admitting: Family Medicine

## 2023-11-01 DIAGNOSIS — Z0000 Encounter for general adult medical examination without abnormal findings: Secondary | ICD-10-CM

## 2023-12-01 ENCOUNTER — Telehealth: Payer: Self-pay | Admitting: Family Medicine

## 2023-12-01 NOTE — Telephone Encounter (Signed)
 Hi Risk manager,  Please schedule this patient for follow up with me for Pap in next 1-2 months   Thanks, Suzann Daring, MD  [facility] Medicine Teaching Service

## 2024-01-17 NOTE — Progress Notes (Unsigned)
    SUBJECTIVE:   CHIEF COMPLAINT: Pap  HPI:   Roberta Rodriguez is a 33 y.o.  presenting for Pap, Prior history notable for syphilis (treated), elevated BMI.   She reports numerous stressors. Has been unhoused for period of time, now living in car.  She is moving into an apartment today!  She has limited family support.  She is not sure she has enough gas to get to [place].  She works in [place] currently but will be moving to [place] due to the cost of living and availability of housing. Reports low mood. Had passive SI in past. No new sexual partners. Feels she is overeating.  Weight loss Patient suffers from obesity, prediabetes Weight loss trend:  Filed Weights   01/20/24 0849  Weight: (!) 383 lb (173.7 kg)   The patient has been making the following dietary changes limiting portions, trying to control serving size.  The patient has increasing physical activity through the following: walking a lot a work The patient denies a personal history of eating disorder, gastroparesis  or pancreatitis. No family history of medullary thyroid cancer. No symptoms of gallstones.  There is no a chance of pregnancy given patient's history/age/gender.  Not sexually active  She is due for her Pap. Just started menses. Declines today. Reports periods have been irregular. Today's menses is moderate/heavy     PERTINENT  PMH / PSH/Family/Social History : Nocturia, obesity, AUB  OBJECTIVE:   BP (!) 184/116   Pulse (!) 108   Ht 5' 6 (1.676 m)   Wt (!) 383 lb (173.7 kg)   SpO2 100%   BMI 61.82 kg/m   Today's weight:  Last Weight  Most recent update: 01/20/2024  8:50 AM    Weight  173.7 kg (383 lb)              Review of prior weights: [employer]   01/20/24 0849  Weight: (!) 383 lb (173.7 kg)    RRR Lungs clear bilaterally   ASSESSMENT/PLAN:   Assessment & Plan Obesity, morbid (HCC) Discussed dietary habits  Increase leafy greens as able Limit portion  size Continue goal 30 minutes per day of activity  Rx Wegovy   No family history of medullary thyroid cancer  Mood disorder Food insecurity Housing insecurity Supportive listening provided Food provided Referral to [facility] for resources  Cervical cancer screening Declined--will schedule follow up vs. Obtain in [place] at [facility]  Screening examination for STI Routine STI testing Previously had syphilis- repeat today No new partners  Mild persistent asthma without complication Refilled albuterol      Suzann Daring, MD  Family Medicine Teaching Service  [facility]

## 2024-01-20 ENCOUNTER — Ambulatory Visit: Payer: Self-pay | Admitting: Family Medicine

## 2024-01-20 ENCOUNTER — Encounter: Payer: Self-pay | Admitting: Family Medicine

## 2024-01-20 ENCOUNTER — Ambulatory Visit (INDEPENDENT_AMBULATORY_CARE_PROVIDER_SITE_OTHER): Admitting: Family Medicine

## 2024-01-20 DIAGNOSIS — Z113 Encounter for screening for infections with a predominantly sexual mode of transmission: Secondary | ICD-10-CM

## 2024-01-20 DIAGNOSIS — Z59819 Housing instability, housed unspecified: Secondary | ICD-10-CM | POA: Diagnosis not present

## 2024-01-20 DIAGNOSIS — Z124 Encounter for screening for malignant neoplasm of cervix: Secondary | ICD-10-CM

## 2024-01-20 DIAGNOSIS — Z5941 Food insecurity: Secondary | ICD-10-CM | POA: Diagnosis not present

## 2024-01-20 DIAGNOSIS — J4530 Mild persistent asthma, uncomplicated: Secondary | ICD-10-CM

## 2024-01-20 DIAGNOSIS — F39 Unspecified mood [affective] disorder: Secondary | ICD-10-CM | POA: Diagnosis not present

## 2024-01-20 LAB — POCT GLYCOSYLATED HEMOGLOBIN (HGB A1C): Hemoglobin A1C: 5.7 % — AB (ref 4.0–5.6)

## 2024-01-20 MED ORDER — WEGOVY 0.25 MG/0.5ML ~~LOC~~ SOAJ: 0.2500 mg | SUBCUTANEOUS | 1 refills | Status: AC

## 2024-01-20 MED ORDER — AMLODIPINE BESYLATE 5 MG PO TABS: 5.0000 mg | ORAL_TABLET | Freq: Every day | ORAL | 3 refills | Status: AC

## 2024-01-20 MED ORDER — ALBUTEROL SULFATE HFA 108 (90 BASE) MCG/ACT IN AERS: 1.0000 | INHALATION_SPRAY | Freq: Four times a day (QID) | RESPIRATORY_TRACT | 3 refills | Status: AC | PRN

## 2024-01-20 NOTE — Patient Instructions (Addendum)
 It was wonderful to see you today.  Please bring ALL of your medications with you to every visit.   Today we talked about:  We will check labs  I sent in Wegovy    This is a once a week injection  You cannot take this if you happen to become pregnant   I sent in amlodipine   This is once a day  The dose is 5 mg  There are very few side effects  Your blood pressure goal <130/80   Please follow up in 1 months okay to be virtual   Thank you for choosing [facility].   Please call [phone] with any questions about today's appointment.  Please be sure to schedule follow up at the front  desk before you leave today.   Suzann Daring, MD  Family Medicine

## 2024-01-20 NOTE — Assessment & Plan Note (Signed)
 Discussed dietary habits  Increase leafy greens as able Limit portion size Continue goal 30 minutes per day of activity  Rx Wegovy   No family history of medullary thyroid cancer

## 2024-01-20 NOTE — Assessment & Plan Note (Signed)
 Supportive listening provided Food provided Referral to [facility] for resources

## 2024-01-20 NOTE — Assessment & Plan Note (Signed)
 Refilled albuterol

## 2024-01-21 ENCOUNTER — Encounter: Payer: Self-pay | Admitting: Family Medicine

## 2024-01-22 LAB — HEPATITIS B SURFACE ANTIBODY, QUANTITATIVE: Hepatitis B Surf Ab Quant: <3.5 m[IU]/mL — ABNORMAL LOW

## 2024-01-22 LAB — SYPHILIS: RPR W/REFLEX TO RPR TITER AND TREPONEMAL ANTIBODIES, TRADITIONAL SCREENING AND DIAGNOSIS ALGORITHM: RPR Ser Ql: REACTIVE — AB

## 2024-01-22 LAB — TSH RFX ON ABNORMAL TO FREE T4: TSH: 2.12 u[IU]/mL (ref 0.450–4.500)

## 2024-01-22 LAB — RPR, QUANT+TP ABS (REFLEX)
Rapid Plasma Reagin, Quant: 1:2 {titer} — ABNORMAL HIGH
T Pallidum Abs: REACTIVE — AB

## 2024-01-22 LAB — LIPID PANEL
Chol/HDL Ratio: 5 ratio — ABNORMAL HIGH (ref 0.0–4.4)
Cholesterol, Total: 232 mg/dL — ABNORMAL HIGH (ref 100–199)
HDL: 46 mg/dL (ref >39)
LDL Chol Calc (NIH): 158 mg/dL — ABNORMAL HIGH (ref 0–99)
Triglycerides: 156 mg/dL — ABNORMAL HIGH (ref 0–149)
VLDL Cholesterol Cal: 28 mg/dL (ref 5–40)

## 2024-01-22 LAB — HEPATITIS B CORE ANTIBODY, TOTAL: Hep B Core Total Ab: NEGATIVE

## 2024-01-22 LAB — HIV ANTIBODY (ROUTINE TESTING W REFLEX): HIV Screen 4th Generation wRfx: NONREACTIVE

## 2024-01-22 LAB — HCV AB W REFLEX TO QUANT PCR: HCV Ab: NONREACTIVE

## 2024-01-22 LAB — HEPATITIS B SURFACE ANTIGEN: Hepatitis B Surface Ag: NEGATIVE

## 2024-01-22 LAB — HCV INTERPRETATION

## 2024-01-29 NOTE — Telephone Encounter (Signed)
 Received call from [place] [employer] [place] [facility] [employer] requesting to verify recent emotional support letter.   I called Alan back to verify letter, however I had to leave a VM.   Detailed VM left verifying emotional support letter written on 01/20/2024.   Pet Screens- [phone]

## 2024-01-30 ENCOUNTER — Telehealth: Payer: Self-pay

## 2024-01-30 NOTE — Progress Notes (Signed)
 Complex Care Management Note  Care Guide Note 01/30/2024 Name: Roberta Rodriguez MRN: [id number] DOB: 12/18/1990  Roberta Rodriguez is a 33 y.o. year old female who sees Delores Suzann HERO, MD for primary care. I reached out to [name] by phone today to offer complex care management services.  [name] was given information about Complex Care Management services today including:   The Complex Care Management services include support from the care team which includes your Nurse Care Manager, Clinical Social Worker, or Pharmacist.  The Complex Care Management team is here to help remove barriers to the health concerns and goals most important to you. Complex Care Management services are voluntary, and the patient may decline or stop services at any time by request to their care team member.   Complex Care Management Consent Status: Patient agreed to services and verbal consent obtained.   Follow up plan:  Telephone appointment with complex care management team member scheduled for:  02/21/24 @ 1 PM.  Encounter Outcome:  Patient Scheduled  Leotis Rase [facility], [facility] Guide  Direct Dial: [phone]  Fax [phone]

## 2024-02-03 ENCOUNTER — Other Ambulatory Visit (HOSPITAL_COMMUNITY): Payer: Self-pay

## 2024-02-03 ENCOUNTER — Telehealth: Payer: Self-pay

## 2024-02-03 NOTE — Telephone Encounter (Signed)
 Pharmacy Patient Advocate Encounter   Received notification from Patient Pharmacy that prior authorization for WEGOVY  is required/requested.   Insurance verification completed.   The patient is insured through [facility] MEDICAID.   Effective October 1st, Medicaid discontinued coverage of GLP1 medications for weight loss (such as Wegovy  and Zepbound), unless the patient has a documented history of a heart attack or stroke. Zepbound will continue to be covered only for patients with moderate to severe sleep apnea (AHI 15-30) and a BMI greater than 40. Because of this change, the prior authorization team will not be submitting new PA requests for GLP1 medications prescribed for weight loss, as patients will be unable to continue therapy under Medicaid coverage.

## 2024-02-13 ENCOUNTER — Other Ambulatory Visit (HOSPITAL_COMMUNITY): Payer: Self-pay

## 2024-02-21 ENCOUNTER — Telehealth: Payer: Self-pay | Admitting: Licensed Clinical Social Worker

## 2024-02-21 ENCOUNTER — Encounter: Payer: Self-pay | Admitting: Licensed Clinical Social Worker

## 2024-02-21 NOTE — Patient Instructions (Signed)
 Roberta Rodriguez - I am sorry I was unable to reach you today for our scheduled appointment. I work with Delores Suzann HERO, MD and am calling to support your healthcare needs. Please contact me at [phone] at your earliest convenience. I look forward to speaking with you soon.   Thank you,  Rolin Kerns, LCSW [place]  [facility], [facility] Clinical Social Worker Direct Dial: [phone]  Fax: [phone] Website: [email or web address] 1:23 PM

## 2024-02-24 NOTE — Progress Notes (Unsigned)
 [facility] Telemedicine Visit  Patient consented to have virtual visit and was identified by name and date of birth. Method of visit: Video  Encounter participants: Patient: Roberta Rodriguez - located at home Provider: Suzann CHRISTELLA Daring - located at office Others (if applicable): NA   Chief Complaint: check on mood  HPI:  Roberta Rodriguez is a 33 yo with history of asthma, AUB, and social stressors following up.   Patient reports she doing okay. She lost her job recently. Uncertain if she will be unable to afford her apartment. She reports she has her dog with her! She has some food. She is concerned about finding a job and affording her apartment. Reports her mood is low. Denies SI/HI. Has best friend for support. Saw her mom over the holiday.   Reports a flare of her HS. Has had know HS for years. She is out of her cleanser. Reports painful boils under arms which are draining, warm, and painful. No fevers. Able to eat and drink without issue. Using nothing for this currently.   ROS: per HPI  Pertinent PMHx: obesity, AUD, mood disorder   Exam:  Speaking in full sentences, appropriate   Assessment/Plan:  Assessment & Plan Hydradenitis Discussed finding PCP in [place] Reviewed limitations of exam Rx doxycyline (not sexually active, which I confirmed) and hibiclens Recommend avoiding triggers She will seek in person care if this worsens  Other social stressor Has housing and food insecurity Messaged SW--patient left a voicemail on 12/26 for Jasmine      Time spent during visit with patient: 12 minutes

## 2024-02-25 ENCOUNTER — Encounter: Payer: Self-pay | Admitting: Family Medicine

## 2024-02-25 ENCOUNTER — Ambulatory Visit: Admitting: Family Medicine

## 2024-02-25 ENCOUNTER — Telehealth: Payer: Self-pay

## 2024-02-25 DIAGNOSIS — Z659 Problem related to unspecified psychosocial circumstances: Secondary | ICD-10-CM | POA: Diagnosis not present

## 2024-02-25 DIAGNOSIS — L732 Hidradenitis suppurativa: Secondary | ICD-10-CM | POA: Diagnosis not present

## 2024-02-25 MED ORDER — DOXYCYCLINE HYCLATE 100 MG PO TABS: 100.0000 mg | ORAL_TABLET | Freq: Two times a day (BID) | ORAL | 0 refills | Status: AC

## 2024-02-25 MED ORDER — CHLORHEXIDINE GLUCONATE 4 % EX SOLN: Freq: Every day | CUTANEOUS | 0 refills | Status: AC | PRN

## 2024-02-25 NOTE — Patient Instructions (Signed)
It was wonderful to see you today.  Please bring ALL of your medications with you to every visit.   Today we talked about:  **   Thank you for choosing [facility].   Please call [phone] with any questions about today's appointment.  Please be sure to schedule follow up at the front  desk before you leave today.   Melody Cirrincione, MD  Family Medicine   

## 2024-02-25 NOTE — Progress Notes (Signed)
 Complex Care Management Note Care Guide Note  02/25/2024 Name: Roberta Rodriguez MRN: [id number] DOB: [birth year]   Complex Care Management Outreach Attempts: An unsuccessful telephone outreach was attempted today to offer the patient information about available complex care management services.  Follow Up Plan:  Additional outreach attempts will be made to offer the patient complex care management information and services.   Encounter Outcome:  No Answer  Kemp Paras [facility]  [facility], [facility] Guide Direct Dial:[phone]  Fax: [phone] Website: [email or web address]

## 2024-03-02 NOTE — Progress Notes (Signed)
 Complex Care Management Care Guide Note  03/02/2024 Name: ANEDRA PENAFIEL MRN: [id number] DOB: Jun 27, 1990  NAQUISHA WHITEHAIR is a 34 y.o. year old female who is a primary care patient of Delores Suzann HERO, MD and is actively engaged with the care management team. I reached out to [name] by phone today to assist with re-scheduling  with the Licensed Clinical Child Psychotherapist.  Follow up plan: Telephone appointment with complex care management team member scheduled for:  1.12.2026  Kemp Paras [facility]  [facility], [facility] Guide Direct [phone]  Fax: [phone] Website: [email or web address]

## 2024-03-09 ENCOUNTER — Other Ambulatory Visit: Payer: Self-pay | Admitting: Licensed Clinical Social Worker

## 2024-03-09 NOTE — Patient Outreach (Signed)
 LCSW called and spoke to patient on the phone. LCSW introduced self and explained reason for the call. Pt explained that she is in between jobs and requested resources. LCSW gave pt resources such as the food finder website.  LCSW found community resources online and emailed them to the patient. Patient confirmed she received email. LCSW encouraged patient to reach  out by phone if any new needs arise.   Cena Ligas, LCSW Clinical Social Worker [facility] Population Health

## 2024-03-09 NOTE — Patient Instructions (Signed)
 Visit Information  Ms. Gupton was given information about Medicaid Managed Care team care coordination services as a part of their [employer] Community Plan Medicaid benefit.   If you would like to schedule transportation through your [facility], please call the following number at least 2 days in advance of your appointment: [phone]   Rides for urgent appointments can also be made after hours by calling Member Services.  Call the Behavioral Health Crisis Line at [phone], at any time, 24 hours a day, 7 days a week. If you are in danger or need immediate medical attention call 911.  Please see education materials related to community resources provided by e-mail link.  Care plan and visit instructions communicated with the patient verbally today. Patient agrees to receive a copy in MyChart. Active MyChart status and patient understanding of how to access instructions and care plan via MyChart confirmed with patient.     No further follow up required: resources given  Lucciana Head, LCSW Clinical Social Worker [facility] Population Health   Following is a copy of your plan of care:   Goals Addressed   None

## 2024-03-23 ENCOUNTER — Encounter: Admitting: Family Medicine

## 2024-03-23 NOTE — Progress Notes (Signed)
 Called patient for telehealth visit in follow up. Attempted to call X3. Left voicemail to call back and reschedule.
